# Patient Record
Sex: Female | Born: 1995 | Race: Black or African American | Hispanic: No | Marital: Single | Smoking: Current every day smoker
Health system: Southern US, Community
[De-identification: ages and names within clinical notes are randomized; demographics above are authoritative.]

## PROBLEM LIST (undated history)

## (undated) DIAGNOSIS — K219 Gastro-esophageal reflux disease without esophagitis: Secondary | ICD-10-CM

## (undated) DIAGNOSIS — E079 Disorder of thyroid, unspecified: Secondary | ICD-10-CM

## (undated) HISTORY — PX: WISDOM TOOTH EXTRACTION: SHX21

---
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
## 2013-01-13 ENCOUNTER — Ambulatory Visit: Payer: Self-pay | Admitting: Physician Assistant

## 2013-08-15 ENCOUNTER — Ambulatory Visit: Payer: Self-pay | Admitting: Family Medicine

## 2013-10-01 ENCOUNTER — Ambulatory Visit: Payer: Self-pay | Admitting: Family Medicine

## 2013-10-13 ENCOUNTER — Encounter: Payer: Self-pay | Admitting: Maternal & Fetal Medicine

## 2013-11-29 ENCOUNTER — Observation Stay: Payer: Self-pay

## 2013-11-29 LAB — URINALYSIS, COMPLETE
Bacteria: NONE SEEN
Bilirubin,UR: NEGATIVE
Glucose,UR: NEGATIVE mg/dL (ref 0–75)
Ketone: NEGATIVE
Leukocyte Esterase: NEGATIVE
Nitrite: NEGATIVE
PH: 6 (ref 4.5–8.0)
Protein: NEGATIVE
RBC,UR: 8 /HPF (ref 0–5)
Specific Gravity: 1.021 (ref 1.003–1.030)

## 2013-12-08 ENCOUNTER — Encounter: Payer: Self-pay | Admitting: Obstetrics and Gynecology

## 2014-02-24 ENCOUNTER — Observation Stay: Payer: Self-pay | Admitting: Obstetrics and Gynecology

## 2014-02-24 ENCOUNTER — Inpatient Hospital Stay: Payer: Self-pay | Admitting: Obstetrics and Gynecology

## 2014-02-24 LAB — CBC WITH DIFFERENTIAL/PLATELET
BASOS ABS: 0.1 10*3/uL (ref 0.0–0.1)
Basophil %: 0.3 %
EOS ABS: 0 10*3/uL (ref 0.0–0.7)
Eosinophil %: 0.1 %
HCT: 36.1 % (ref 35.0–47.0)
HGB: 12.4 g/dL (ref 12.0–16.0)
LYMPHS PCT: 12.4 %
Lymphocyte #: 2.1 10*3/uL (ref 1.0–3.6)
MCH: 30.9 pg (ref 26.0–34.0)
MCHC: 34.3 g/dL (ref 32.0–36.0)
MCV: 90 fL (ref 80–100)
MONO ABS: 1.1 x10 3/mm — AB (ref 0.2–0.9)
Monocyte %: 6.3 %
NEUTROS PCT: 80.9 %
Neutrophil #: 13.8 10*3/uL — ABNORMAL HIGH (ref 1.4–6.5)
PLATELETS: 178 10*3/uL (ref 150–440)
RBC: 4 10*6/uL (ref 3.80–5.20)
RDW: 13.6 % (ref 11.5–14.5)
WBC: 17.1 10*3/uL — ABNORMAL HIGH (ref 3.6–11.0)

## 2014-02-25 LAB — GC/CHLAMYDIA PROBE AMP

## 2014-02-26 LAB — HEMOGLOBIN: HGB: 10.4 g/dL — ABNORMAL LOW (ref 12.0–16.0)

## 2014-05-13 ENCOUNTER — Emergency Department: Payer: Self-pay | Admitting: Emergency Medicine

## 2014-05-13 LAB — GC/CHLAMYDIA PROBE AMP

## 2014-05-13 LAB — WET PREP, GENITAL

## 2014-06-30 ENCOUNTER — Ambulatory Visit: Payer: Self-pay | Admitting: Family Medicine

## 2014-11-03 NOTE — H&P (Signed)
L&D Evaluation:  History:   HPI 19 yo G2P1011 with LMP of 10/09/10 & [facility] 07/16/11 [facility] for Rh neg, Varicella equivocal, GERD and Alopecia. Pt presents in active labor this am with "UC's becoming more uncomfortable", No ROM, VB or decreased FM.    Presents with contractions    Patient's Medical History GERD, Scabies, Trich, Alopecia, BV, allergic rhinitis, proteinuria    Patient's Surgical History none    Medications Pre Natal Vitamins    Allergies NKDA    Social History tobacco   ROS:   ROS All systems were reviewed.  HEENT, CNS, GI, GU, Respiratory, CV, Renal and Musculoskeletal systems were found to be normal.   Exam:   Vital Signs stable    General no apparent distress    Mental Status clear    Chest clear    Heart normal sinus rhythm, no murmur/gallop/rubs    Abdomen gravid, non-tender    Estimated Fetal Weight Average for gestational age    Back no CVAT    Edema 1+    Reflexes 1+    Clonus negative    Pelvic 3-4/vtx    Mebranes Intact    FHT normal rate with no decels    Ucx q 6 mins, mod to palp    Skin dry    Lymph no lymphadenopathy   Impression:   Impression active labor   Plan:   Plan monitor contractions and for cervical change    Comments GBS neg   Electronic Signatures: Sharee PimpleJones, Caron W (CNM)  (Signed 23-Jan-13 09:49)  Authored: L&D Evaluation   Last Updated: 23-Jan-13 09:49 by Sharee PimpleJones, Caron W (CNM)

## 2014-11-03 NOTE — H&P (Signed)
L&D Evaluation:  History:  HPI 17y G3P1011 at 39.6wks presenting for labor evaluation. Last cervical exam 4 cm in office on 02/18/14, unchanged after observation here. No LOF, no vag bleeding, good FM.  Pregnancy complicated by: *Bipolar- on Latuda (Cat B), with bouts of depression. Last office Edinburgh score 8. *Chlamydia this pregnancy, treated at 36wks, no TOC yet. [facility] * 19wk anatomy scan with ?fetal arrythmia, [facility] and fetal echo, which showed no arrythmia. Repeat at 28swks confirmed. - RH neg, s/p Rhogam at 28 wks - GBS neg - Tobacco use i npregnancy -   Presents with contractions   Patient's Medical History Bipolar, hx STDs   Patient's Surgical History none   Medications Pre Natal Vitamins   Allergies NKDA   Social History tobacco  drugs   Family History Non-Contributory   ROS:  ROS All systems were reviewed.  HEENT, CNS, GI, GU, Respiratory, CV, Renal and Musculoskeletal systems were found to be normal.   Exam:  Vital Signs stable   Urine Protein not completed   General no apparent distress   Mental Status clear   Chest clear   Heart normal sinus rhythm   Abdomen gravid, non-tender   Estimated Fetal Weight Average for gestational age   Fetal Position vtx   Back no CVAT   Edema no edema   Pelvic no external lesions   Mebranes Intact   Impression:  Impression early labor   Plan:  Comments NST reactive, irregular contractions. Unchanged cervix after monitoring - Schedule 41 wk induction - D/C home with labor precautions.   Follow Up Appointment need to schedule. in 1 week   Electronic Signatures: Cline CoolsBeasley, Khamiyah Grefe E (MD)  (Signed 01-Sep-15 08:22)  Authored: L&D Evaluation   Last Updated: 01-Sep-15 08:22 by Cline CoolsBeasley, Astaria Nanez E (MD)

## 2014-11-03 NOTE — H&P (Signed)
L&D Evaluation:  History:  HPI 17y G3P1011 at 39.6wks presenting for LOF, no vag bleeding, good FM. Found to be ctx q2-4 min, bulging bag, cervical exam 5/80/0, vtx by sutures.  Pregnancy complicated by: *Bipolar- on Latuda (Cat B), with bouts of depression. Last office Edinburgh score 8. *Chlamydia this pregnancy, treated at 36wks, no TOC yet. [facility] * 19wk anatomy scan with ?fetal arrythmia, [facility] and fetal echo, which showed no arrythmia. Repeat at 28swks confirmed. - RH neg, s/p Rhogam at 28 wks - GBS neg - Tobacco use in pregnancy   Presents with contractions   Patient's Medical History Bipolar, hx STDs   Patient's Surgical History none   Medications Pre Natal Vitamins   Allergies NKDA   Social History tobacco  drugs   Family History Non-Contributory   ROS:  ROS All systems were reviewed.  HEENT, CNS, GI, GU, Respiratory, CV, Renal and Musculoskeletal systems were found to be normal.   Exam:  Vital Signs stable   Urine Protein not completed   General no apparent distress   Mental Status clear   Chest clear   Heart normal sinus rhythm   Abdomen gravid, non-tender   Estimated Fetal Weight Average for gestational age   Fetal Position vtx   Back no CVAT   Edema no edema   Pelvic no external lesions   Mebranes Intact   FHT normal rate with no decels   Ucx regular   Ucx Frequency 3 min   Length of each Contraction 60 seconds   Ucx Pain Scale 8/10   Skin dry   Lymph no lymphadenopathy   Impression:  Impression active labor   Plan:  Plan monitor contractions and for cervical change   Comments Admission for labor Desires epidural   Electronic Signatures: Cline CoolsBeasley, Liliah Dorian E (MD)  (Signed 01-Sep-15 18:51)  Authored: L&D Evaluation   Last Updated: 01-Sep-15 18:51 by Cline CoolsBeasley, Delshawn Stech E (MD)

## 2015-06-22 ENCOUNTER — Other Ambulatory Visit: Payer: Self-pay | Admitting: Family Medicine

## 2015-06-22 DIAGNOSIS — N938 Other specified abnormal uterine and vaginal bleeding: Secondary | ICD-10-CM

## 2015-06-23 ENCOUNTER — Ambulatory Visit
Admission: RE | Admit: 2015-06-23 | Discharge: 2015-06-23 | Disposition: A | Discharge status: Home / Self Care | Payer: Medicaid Other | Source: Ambulatory Visit | Attending: Family Medicine | Admitting: Family Medicine

## 2015-06-23 DIAGNOSIS — N83291 Other ovarian cyst, right side: Secondary | ICD-10-CM | POA: Diagnosis not present

## 2015-06-23 DIAGNOSIS — N938 Other specified abnormal uterine and vaginal bleeding: Secondary | ICD-10-CM | POA: Diagnosis present

## 2015-06-27 NOTE — L&D Delivery Note (Signed)
Delivery Note At 12:33 AM a viable and healthy female was delivered via Vaginal, Spontaneous Delivery (Presentation:direct OA  ).  APGAR: 9, 9; weight pending .   Placenta status: expressed, .  Cord: 3VC with the following complications: Cord blood collected  Anesthesia:  epidural Episiotomy: None Lacerations: None Est. Blood Loss (mL): 200  Mom to postpartum.  Baby to Couplet care / Skin to Skin.  19yo [id number] at 40+0 wks presenting with rupture of membranes sometime last night for clear fluid. She progressed with minimal augmentation to fully dilated and pushed over an intact perineum with a good epidural. She pushed x2 with fetal head emerging followed immediately by the fetal body. No nuchal cord. The placenta delivered through a clamped cervix intact with trailing membranes. Manual evac no tissue. Minimal bleeding, no tears. Delayed cord clamping, baby to maternal abdomen. Weight pending. Bottle feeding.  Add Dinapoli 05/29/2016, 1:00 AM

## 2015-08-05 ENCOUNTER — Encounter: Payer: Self-pay | Admitting: Emergency Medicine

## 2015-08-05 ENCOUNTER — Emergency Department
Admission: EM | Admit: 2015-08-05 | Discharge: 2015-08-05 | Disposition: A | Discharge status: Home / Self Care | Payer: Medicaid Other | Attending: Emergency Medicine | Admitting: Emergency Medicine

## 2015-08-05 DIAGNOSIS — R51 Headache: Secondary | ICD-10-CM | POA: Diagnosis present

## 2015-08-05 DIAGNOSIS — F17210 Nicotine dependence, cigarettes, uncomplicated: Secondary | ICD-10-CM | POA: Diagnosis not present

## 2015-08-05 DIAGNOSIS — J101 Influenza due to other identified influenza virus with other respiratory manifestations: Secondary | ICD-10-CM | POA: Insufficient documentation

## 2015-08-05 LAB — RAPID INFLUENZA A&B ANTIGENS (ARMC ONLY)
INFLUENZA A (ARMC): POSITIVE
INFLUENZA B (ARMC): NEGATIVE

## 2015-08-05 MED ORDER — IBUPROFEN 600 MG PO TABS
600.0000 mg | ORAL_TABLET | Freq: Once | ORAL | Status: AC
  Administered 2015-08-05: 600 mg via ORAL
  Filled 2015-08-05: qty 1

## 2015-08-05 MED ORDER — OSELTAMIVIR PHOSPHATE 75 MG PO CAPS: 75.0000 mg | ORAL_CAPSULE | Freq: Two times a day (BID) | ORAL | Status: DC

## 2015-08-05 MED ORDER — SODIUM CHLORIDE 0.9 % IV BOLUS (SEPSIS)
1000.0000 mL | Freq: Once | INTRAVENOUS | Status: AC
  Administered 2015-08-05: 1000 mL via INTRAVENOUS

## 2015-08-05 MED ORDER — HYDROCODONE-ACETAMINOPHEN 5-325 MG PO TABS: 1.0000 | ORAL_TABLET | ORAL | Status: DC | PRN

## 2015-08-05 NOTE — ED Notes (Signed)
Patient complaining of Headache, body aches, vomiting.  Started feeling bad Tuesday evening.  Patient states her son's Father had similar symptoms.  Coughing expectorating brown material.

## 2015-08-05 NOTE — ED Notes (Signed)
Patient given Motrin PO.  IV infusing well right arm.  Mother and daughter at bedside.

## 2015-08-05 NOTE — Discharge Instructions (Signed)
Influenza, Adult Influenza (flu) is an infection in the mouth, nose, and throat (respiratory tract) caused by a virus. The flu can make you feel very ill. Influenza spreads easily from person to person (contagious).  HOME CARE   Only take medicines as told by your doctor.  Use a cool mist humidifier to make breathing easier.  Get plenty of rest until your fever goes away. This usually takes 3 to 4 days.  Drink enough fluids to keep your pee (urine) clear or pale yellow.  Cover your mouth and nose when you cough or sneeze.  Wash your hands well to avoid spreading the flu.  Stay home from work or school until your fever has been gone for at least 1 full day.  Get a flu shot every year. GET HELP RIGHT AWAY IF:   You have trouble breathing or feel short of breath.  Your skin or nails turn blue.  You have severe neck pain or stiffness.  You have a severe headache, facial pain, or earache.  Your fever gets worse or keeps coming back.  You feel sick to your stomach (nauseous), throw up (vomit), or have watery poop (diarrhea).  You have chest pain.  You have a deep cough that gets worse, or you cough up more thick spit (mucus). MAKE SURE YOU:   Understand these instructions.  Will watch your condition.  Will get help right away if you are not doing well or get worse.   This information is not intended to replace advice given to you by your health care provider. Make sure you discuss any questions you have with your health care provider.   Document Released: 03/21/2008 Document Revised: 07/03/2014 Document Reviewed: 09/11/2011 Elsevier Interactive Patient Education [employer]   Follow-up with your doctor if any continued problems. Tamiflu twice a day for 7 days. Norco for body aches and for coughing. You may need to take ibuprofen and also with this to control her temperature. Increase fluids.

## 2015-08-05 NOTE — ED Notes (Signed)
Labs drawn with IV start.  Rapid influenza done.

## 2015-08-05 NOTE — ED Notes (Signed)
IV Discontinued from right forearm without redness or drainage.

## 2015-08-05 NOTE — ED Provider Notes (Signed)
[facility] Emergency Department Provider Note ____________________________________________  Time seen: Approximately 8:48 AM  I have reviewed the triage vital signs and the nursing notes.   HISTORY  Chief Complaint Generalized Body Aches   HPI Darlene Kelley is a 20 y.o. female is here with complaint of body aches, headache, fever for 2 days. Patient states that she vomited a couple times and has a brown productive cough. Patient has not taken any over-the-counter medication for fever reduction today. Denies any diarrhea. She states that her son's father has similar symptoms.Currently she rates her pain as a 9 out of 10.   History reviewed. No pertinent past medical history.  There are no active problems to display for this patient.   History reviewed. No pertinent past surgical history.  Current Outpatient Rx  Name  Route  Sig  Dispense  Refill  . HYDROcodone-acetaminophen (NORCO/VICODIN) 5-325 MG tablet   Oral   Take 1 tablet by mouth every 4 (four) hours as needed for moderate pain.   20 tablet   0   . oseltamivir (TAMIFLU) 75 MG capsule   Oral   Take 1 capsule (75 mg total) by mouth 2 (two) times daily.   14 capsule   0     Allergies Review of patient's allergies indicates no known allergies.  No family history on file.  Social History Social History  Substance Use Topics  . Smoking status: Current Every Day Smoker -- 0.50 packs/day    Types: Cigarettes  . Smokeless tobacco: None  . Alcohol Use: Yes     Comment: Occasional    Review of Systems Constitutional: Positive fever/chills Eyes: No visual changes. ENT: No sore throat. Cardiovascular: Denies chest pain. Respiratory: Denies shortness of breath. Positive productive cough. Gastrointestinal: No abdominal pain.  No nausea, positive vomiting.  No diarrhea.  No constipation. Genitourinary: Negative for dysuria. Musculoskeletal: Negative for back pain. Skin: Negative for  rash. Neurological: Negative for headaches, focal weakness or numbness.  10-point ROS otherwise negative.  ____________________________________________   PHYSICAL EXAM:  VITAL SIGNS: ED Triage Vitals  Enc Vitals Group     BP --      Pulse --      Resp --      Temp --      Temp src --      SpO2 --      Weight --      Height --      Head Cir --      Peak Flow --      Pain Score --      Pain Loc --      Pain Edu? --      Excl. in GC? --     Constitutional: Alert and oriented. Well appearing and in no acute distress. Patient makes little eye contact and appears to be ill. Eyes: Conjunctivae are normal. PERRL. EOMI. Head: Atraumatic. Nose: Moderate congestion/no rhinnorhea.  EACs and TMs are clear bilaterally. Mouth/Throat: Mucous membranes are moist.  Oropharynx non-erythematous. Neck: No stridor.   Hematological/Lymphatic/Immunilogical: No cervical lymphadenopathy. Cardiovascular: Normal rate, regular rhythm. Grossly normal heart sounds.  Good peripheral circulation. Respiratory: Normal respiratory effort.  No retractions. Lungs CTAB. Gastrointestinal: Soft and nontender. No distention.  Musculoskeletal: His upper and lower extremities without difficulty. Neurologic:  Normal speech and language. No gross focal neurologic deficits are appreciated. No gait instability. Skin:  Skin is warm, dry and intact. No rash noted. Psychiatric: Mood and affect are normal. Speech and behavior  are normal.  ____________________________________________   LABS (all labs ordered are listed, but only abnormal results are displayed)  Labs Reviewed  RAPID INFLUENZA A&B ANTIGENS ([facility] ONLY)     PROCEDURES  Procedure(s) performed: None  Critical Care performed: No  ____________________________________________   INITIAL IMPRESSION / ASSESSMENT AND PLAN / ED COURSE  Pertinent labs & imaging results that were available during my care of the patient were reviewed by me and considered  in my medical decision making (see chart for details).  Patient was started on Tamiflu for the next 7 days. She is also given a prescription for Norco as needed for headache, fever, and body aches. She is to follow-up with her doctor or [facility] if any continued problems. She is aware that she needs to increase fluids and also take ibuprofen when needed for her fever. ____________________________________________   FINAL CLINICAL IMPRESSION(S) / ED DIAGNOSES  Final diagnoses:  Influenza A      Tommi Rumps, PA-C 08/05/15 1321  Sharyn Creamer, MD 08/05/15 [phone]

## 2015-09-20 ENCOUNTER — Encounter: Payer: Self-pay | Admitting: Emergency Medicine

## 2015-09-20 ENCOUNTER — Emergency Department
Admission: EM | Admit: 2015-09-20 | Discharge: 2015-09-20 | Disposition: A | Discharge status: Home / Self Care | Payer: Medicaid Other | Attending: Emergency Medicine | Admitting: Emergency Medicine

## 2015-09-20 DIAGNOSIS — F17210 Nicotine dependence, cigarettes, uncomplicated: Secondary | ICD-10-CM | POA: Diagnosis not present

## 2015-09-20 DIAGNOSIS — Y92410 Unspecified street and highway as the place of occurrence of the external cause: Secondary | ICD-10-CM | POA: Insufficient documentation

## 2015-09-20 DIAGNOSIS — O9A219 Injury, poisoning and certain other consequences of external causes complicating pregnancy, unspecified trimester: Secondary | ICD-10-CM | POA: Diagnosis not present

## 2015-09-20 DIAGNOSIS — Z3A00 Weeks of gestation of pregnancy not specified: Secondary | ICD-10-CM | POA: Diagnosis not present

## 2015-09-20 DIAGNOSIS — Y998 Other external cause status: Secondary | ICD-10-CM | POA: Insufficient documentation

## 2015-09-20 DIAGNOSIS — Y9389 Activity, other specified: Secondary | ICD-10-CM | POA: Diagnosis not present

## 2015-09-20 DIAGNOSIS — S139XXA Sprain of joints and ligaments of unspecified parts of neck, initial encounter: Secondary | ICD-10-CM

## 2015-09-20 DIAGNOSIS — S134XXA Sprain of ligaments of cervical spine, initial encounter: Secondary | ICD-10-CM | POA: Diagnosis not present

## 2015-09-20 DIAGNOSIS — Z79899 Other long term (current) drug therapy: Secondary | ICD-10-CM | POA: Diagnosis not present

## 2015-09-20 DIAGNOSIS — O99330 Smoking (tobacco) complicating pregnancy, unspecified trimester: Secondary | ICD-10-CM | POA: Insufficient documentation

## 2015-09-20 LAB — POCT PREGNANCY, URINE: Preg Test, Ur: POSITIVE — AB

## 2015-09-20 NOTE — ED Notes (Signed)
Urine sample sent to lab

## 2015-09-20 NOTE — ED Notes (Signed)
Pt with neck pain, tender to touch, no numbness or weakness bilateral. C/o abd cramping. Per pt + pregnant test 2 days ago.
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
## 2015-09-20 NOTE — ED Notes (Signed)
Pt informed to return if any life threatening symptoms occur.  

## 2015-09-20 NOTE — Discharge Instructions (Signed)
Cervical Sprain  A cervical sprain is an injury in the neck in which the strong, fibrous tissues (ligaments) that connect your neck bones stretch or tear. Cervical sprains can range from mild to severe. Severe cervical sprains can cause the neck vertebrae to be unstable. This can lead to damage of the spinal cord and can result in serious nervous system problems. The amount of time it takes for a cervical sprain to get better depends on the cause and extent of the injury. Most cervical sprains heal in 1 to 3 weeks.  CAUSES   Severe cervical sprains may be caused by:    Contact sport injuries (such as from football, rugby, wrestling, hockey, auto racing, gymnastics, diving, martial arts, or boxing).    Motor vehicle collisions.    Whiplash injuries. This is an injury from a sudden forward and backward whipping movement of the head and neck.   Falls.   Mild cervical sprains may be caused by:    Being in an awkward position, such as while cradling a telephone between your ear and shoulder.    Sitting in a chair that does not offer proper support.    Working at a poorly designed computer station.    Looking up or down for long periods of time.   SYMPTOMS    Pain, soreness, stiffness, or a burning sensation in the front, back, or sides of the neck. This discomfort may develop immediately after the injury or slowly, 24 hours or more after the injury.    Pain or tenderness directly in the middle of the back of the neck.    Shoulder or upper back pain.    Limited ability to move the neck.    Headache.    Dizziness.    Weakness, numbness, or tingling in the hands or arms.    Muscle spasms.    Difficulty swallowing or chewing.    Tenderness and swelling of the neck.   DIAGNOSIS   Most of the time your health care provider can diagnose a cervical sprain by taking your history and doing a physical exam. Your health care provider will ask about previous neck injuries and any known neck  problems, such as arthritis in the neck. X-rays may be taken to find out if there are any other problems, such as with the bones of the neck. Other tests, such as a CT scan or MRI, may also be needed.   TREATMENT   Treatment depends on the severity of the cervical sprain. Mild sprains can be treated with rest, keeping the neck in place (immobilization), and pain medicines. Severe cervical sprains are immediately immobilized. Further treatment is done to help with pain, muscle spasms, and other symptoms and may include:   Medicines, such as pain relievers, numbing medicines, or muscle relaxants.    Physical therapy. This may involve stretching exercises, strengthening exercises, and posture training. Exercises and improved posture can help stabilize the neck, strengthen muscles, and help stop symptoms from returning.   HOME CARE INSTRUCTIONS    Put ice on the injured area.     Put ice in a plastic bag.     Place a towel between your skin and the bag.     Leave the ice on for 15-20 minutes, 3-4 times a day.    If your injury was severe, you may have been given a cervical collar to wear. A cervical collar is a two-piece collar designed to keep your neck from moving while it heals.      Do not remove the collar unless instructed by your health care provider.    If you have long hair, keep it outside of the collar.    Ask your health care provider before making any adjustments to your collar. Minor adjustments may be required over time to improve comfort and reduce pressure on your chin or on the back of your head.    Ifyou are allowed to remove the collar for cleaning or bathing, follow your health care provider's instructions on how to do so safely.    Keep your collar clean by wiping it with mild soap and water and drying it completely. If the collar you have been given includes removable pads, remove them every 1-2 days and hand wash them with soap and water. Allow them to air dry. They should be completely  dry before you wear them in the collar.    If you are allowed to remove the collar for cleaning and bathing, wash and dry the skin of your neck. Check your skin for irritation or sores. If you see any, tell your health care provider.    Do not drive while wearing the collar.    Only take over-the-counter or prescription medicines for pain, discomfort, or fever as directed by your health care provider.    Keep all follow-up appointments as directed by your health care provider.    Keep all physical therapy appointments as directed by your health care provider.    Make any needed adjustments to your workstation to promote good posture.    Avoid positions and activities that make your symptoms worse.    Warm up and stretch before being active to help prevent problems.   SEEK MEDICAL CARE IF:    Your pain is not controlled with medicine.    You are unable to decrease your pain medicine over time as planned.    Your activity level is not improving as expected.   SEEK IMMEDIATE MEDICAL CARE IF:    You develop any bleeding.   You develop stomach upset.   You have signs of an allergic reaction to your medicine.    Your symptoms get worse.    You develop new, unexplained symptoms.    You have numbness, tingling, weakness, or paralysis in any part of your body.   MAKE SURE YOU:    Understand these instructions.   Will watch your condition.   Will get help right away if you are not doing well or get worse.     This information is not intended to replace advice given to you by your health care provider. Make sure you discuss any questions you have with your health care provider.     Document Released: 04/09/2007 Document Revised: 06/17/2013 Document Reviewed: 12/18/2012  Elsevier Interactive Patient Education 2016 Else[employer] Inc[employer]
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
## 2015-09-27 ENCOUNTER — Other Ambulatory Visit: Payer: Self-pay | Admitting: Family Medicine

## 2015-09-27 DIAGNOSIS — Z3491 Encounter for supervision of normal pregnancy, unspecified, first trimester: Secondary | ICD-10-CM

## 2015-10-05 ENCOUNTER — Ambulatory Visit
Admission: RE | Admit: 2015-10-05 | Discharge: 2015-10-05 | Disposition: A | Discharge status: Home / Self Care | Payer: Medicaid Other | Source: Ambulatory Visit | Attending: Family Medicine | Admitting: Family Medicine

## 2015-10-05 DIAGNOSIS — Z3481 Encounter for supervision of other normal pregnancy, first trimester: Secondary | ICD-10-CM | POA: Insufficient documentation

## 2015-10-05 DIAGNOSIS — Z3491 Encounter for supervision of normal pregnancy, unspecified, first trimester: Secondary | ICD-10-CM

## 2015-10-19 LAB — OB RESULTS CONSOLE VARICELLA ZOSTER ANTIBODY, IGG: Varicella: IMMUNE

## 2015-10-19 LAB — OB RESULTS CONSOLE HIV ANTIBODY (ROUTINE TESTING): HIV: NONREACTIVE

## 2015-10-19 LAB — OB RESULTS CONSOLE ANTIBODY SCREEN: Antibody Screen: NEGATIVE

## 2015-10-19 LAB — OB RESULTS CONSOLE ABO/RH: RH TYPE: NEGATIVE

## 2015-10-19 LAB — OB RESULTS CONSOLE GC/CHLAMYDIA
Chlamydia: NEGATIVE
GC PROBE AMP, GENITAL: NEGATIVE

## 2015-10-19 LAB — OB RESULTS CONSOLE HEPATITIS B SURFACE ANTIGEN: Hepatitis B Surface Ag: NEGATIVE

## 2015-10-19 LAB — OB RESULTS CONSOLE RPR: RPR: NONREACTIVE

## 2015-10-19 LAB — OB RESULTS CONSOLE RUBELLA ANTIBODY, IGM: Rubella: IMMUNE

## 2015-10-28 ENCOUNTER — Other Ambulatory Visit: Payer: Self-pay | Admitting: Family Medicine

## 2015-10-28 DIAGNOSIS — E010 Iodine-deficiency related diffuse (endemic) goiter: Secondary | ICD-10-CM

## 2015-11-02 ENCOUNTER — Ambulatory Visit
Admission: RE | Admit: 2015-11-02 | Discharge: 2015-11-02 | Disposition: A | Discharge status: Home / Self Care | Payer: Medicaid Other | Source: Ambulatory Visit | Attending: Family Medicine | Admitting: Family Medicine

## 2015-11-02 DIAGNOSIS — E042 Nontoxic multinodular goiter: Secondary | ICD-10-CM | POA: Diagnosis not present

## 2015-11-02 DIAGNOSIS — E010 Iodine-deficiency related diffuse (endemic) goiter: Secondary | ICD-10-CM

## 2015-11-29 ENCOUNTER — Other Ambulatory Visit: Payer: Self-pay | Admitting: Otolaryngology

## 2015-11-29 DIAGNOSIS — E041 Nontoxic single thyroid nodule: Secondary | ICD-10-CM

## 2015-12-03 ENCOUNTER — Ambulatory Visit: Admission: RE | Admit: 2015-12-03 | Payer: Medicaid Other | Source: Ambulatory Visit | Admitting: Otolaryngology

## 2015-12-21 ENCOUNTER — Other Ambulatory Visit: Payer: Self-pay | Admitting: Family Medicine

## 2015-12-21 DIAGNOSIS — Z3492 Encounter for supervision of normal pregnancy, unspecified, second trimester: Secondary | ICD-10-CM

## 2016-01-03 ENCOUNTER — Ambulatory Visit
Admission: RE | Admit: 2016-01-03 | Discharge: 2016-01-03 | Disposition: A | Discharge status: Home / Self Care | Payer: Medicaid Other | Source: Ambulatory Visit | Attending: Family Medicine | Admitting: Family Medicine

## 2016-01-03 DIAGNOSIS — Z3482 Encounter for supervision of other normal pregnancy, second trimester: Secondary | ICD-10-CM | POA: Diagnosis not present

## 2016-01-03 DIAGNOSIS — Z3492 Encounter for supervision of normal pregnancy, unspecified, second trimester: Secondary | ICD-10-CM

## 2016-01-03 DIAGNOSIS — Z3A19 19 weeks gestation of pregnancy: Secondary | ICD-10-CM | POA: Diagnosis not present

## 2016-01-10 ENCOUNTER — Emergency Department
Admission: EM | Admit: 2016-01-10 | Discharge: 2016-01-11 | Disposition: A | Discharge status: Home / Self Care | Payer: Medicaid Other | Attending: Emergency Medicine | Admitting: Emergency Medicine

## 2016-01-10 ENCOUNTER — Encounter: Payer: Self-pay | Admitting: Emergency Medicine

## 2016-01-10 DIAGNOSIS — E876 Hypokalemia: Secondary | ICD-10-CM | POA: Diagnosis not present

## 2016-01-10 DIAGNOSIS — O26892 Other specified pregnancy related conditions, second trimester: Secondary | ICD-10-CM | POA: Diagnosis not present

## 2016-01-10 DIAGNOSIS — Z79899 Other long term (current) drug therapy: Secondary | ICD-10-CM | POA: Insufficient documentation

## 2016-01-10 DIAGNOSIS — O99332 Smoking (tobacco) complicating pregnancy, second trimester: Secondary | ICD-10-CM | POA: Insufficient documentation

## 2016-01-10 DIAGNOSIS — J029 Acute pharyngitis, unspecified: Secondary | ICD-10-CM | POA: Diagnosis not present

## 2016-01-10 DIAGNOSIS — O99512 Diseases of the respiratory system complicating pregnancy, second trimester: Secondary | ICD-10-CM | POA: Insufficient documentation

## 2016-01-10 DIAGNOSIS — E860 Dehydration: Secondary | ICD-10-CM

## 2016-01-10 DIAGNOSIS — F17210 Nicotine dependence, cigarettes, uncomplicated: Secondary | ICD-10-CM | POA: Insufficient documentation

## 2016-01-10 DIAGNOSIS — Z3A20 20 weeks gestation of pregnancy: Secondary | ICD-10-CM | POA: Diagnosis not present

## 2016-01-10 DIAGNOSIS — J209 Acute bronchitis, unspecified: Secondary | ICD-10-CM

## 2016-01-10 DIAGNOSIS — R112 Nausea with vomiting, unspecified: Secondary | ICD-10-CM

## 2016-01-10 DIAGNOSIS — R102 Pelvic and perineal pain: Secondary | ICD-10-CM | POA: Diagnosis present

## 2016-01-10 DIAGNOSIS — Z3492 Encounter for supervision of normal pregnancy, unspecified, second trimester: Secondary | ICD-10-CM

## 2016-01-10 LAB — URINALYSIS COMPLETE WITH MICROSCOPIC (ARMC ONLY)
BILIRUBIN URINE: NEGATIVE
GLUCOSE, UA: NEGATIVE mg/dL
Ketones, ur: NEGATIVE mg/dL
Nitrite: NEGATIVE
Protein, ur: NEGATIVE mg/dL
Specific Gravity, Urine: 1.02 (ref 1.005–1.030)
pH: 6 (ref 5.0–8.0)

## 2016-01-10 LAB — COMPREHENSIVE METABOLIC PANEL
ALBUMIN: 3.1 g/dL — AB (ref 3.5–5.0)
ALK PHOS: 63 U/L (ref 38–126)
ALT: 10 U/L — AB (ref 14–54)
AST: 13 U/L — ABNORMAL LOW (ref 15–41)
Anion gap: 4 — ABNORMAL LOW (ref 5–15)
BUN: 9 mg/dL (ref 6–20)
CHLORIDE: 107 mmol/L (ref 101–111)
CO2: 26 mmol/L (ref 22–32)
CREATININE: 0.39 mg/dL — AB (ref 0.44–1.00)
Calcium: 8.7 mg/dL — ABNORMAL LOW (ref 8.9–10.3)
GFR calc non Af Amer: >60 mL/min (ref >60)
GLUCOSE: 84 mg/dL (ref 65–99)
Potassium: 3.1 mmol/L — ABNORMAL LOW (ref 3.5–5.1)
SODIUM: 137 mmol/L (ref 135–145)
Total Bilirubin: 0.6 mg/dL (ref 0.3–1.2)
Total Protein: 6.4 g/dL — ABNORMAL LOW (ref 6.5–8.1)

## 2016-01-10 LAB — LIPASE, BLOOD: LIPASE: 18 U/L (ref 11–51)

## 2016-01-10 LAB — CBC
HCT: 30.9 % — ABNORMAL LOW (ref 35.0–47.0)
HEMOGLOBIN: 11 g/dL — AB (ref 12.0–16.0)
MCH: 31.7 pg (ref 26.0–34.0)
MCHC: 35.6 g/dL (ref 32.0–36.0)
MCV: 88.9 fL (ref 80.0–100.0)
Platelets: 184 10*3/uL (ref 150–440)
RBC: 3.48 MIL/uL — AB (ref 3.80–5.20)
RDW: 13.1 % (ref 11.5–14.5)
WBC: 14.4 10*3/uL — ABNORMAL HIGH (ref 3.6–11.0)

## 2016-01-10 LAB — HCG, QUANTITATIVE, PREGNANCY: HCG, BETA CHAIN, QUANT, S: 34089 m[IU]/mL — AB (ref <5)

## 2016-01-10 MED ORDER — SODIUM CHLORIDE 0.9 % IV BOLUS (SEPSIS)
1000.0000 mL | Freq: Once | INTRAVENOUS | Status: AC
  Administered 2016-01-10: 1000 mL via INTRAVENOUS

## 2016-01-10 MED ORDER — ONDANSETRON HCL 4 MG/2ML IJ SOLN
4.0000 mg | Freq: Once | INTRAMUSCULAR | Status: AC
  Administered 2016-01-10: 4 mg via INTRAVENOUS
  Filled 2016-01-10: qty 2

## 2016-01-10 NOTE — ED Provider Notes (Addendum)
[facility] Emergency Department Provider Note  ___________________________________________  Time seen: Approximately 11:12 PM  I have reviewed the triage vital signs and the nursing notes.   HISTORY  Chief Complaint Emesis    HPI Darlene Kelley is a 20 y.o. female who is approximately 5 months OB, and has had sore throat, runny nose, congested cough, and multiple episodes of vomiting since yesterday. Patient states also had some urinary frequency and foul smell to her urine, along with suprapubic abdominal pain. Patient states that her daughter was sick and was seen for the same last evening. Patient denies any associated fever, chills, diarrhea, or chest pain/shortness of breath. Patient states that her suprapubic pain on scale of 0-10 is about a 1. She also denies any significant headache, dizziness, or generalized weakness.   History reviewed. No pertinent past medical history.  There are no active problems to display for this patient.   History reviewed. No pertinent past surgical history.  Current Outpatient Rx  Name  Route  Sig  Dispense  Refill  . Prenatal Vit-Fe Fumarate-FA (MULTIVITAMIN-PRENATAL) 27-0.8 MG TABS tablet   Oral   Take 1 tablet by mouth daily.         Marland Kitchen amoxicillin (AMOXIL) 500 MG capsule   Oral   Take 1 capsule (500 mg total) by mouth 3 (three) times daily.   30 capsule   0   . HYDROcodone-acetaminophen (NORCO/VICODIN) 5-325 MG tablet   Oral   Take 1 tablet by mouth every 4 (four) hours as needed for moderate pain. Patient not taking: Reported on 01/10/2016   20 tablet   0   . ondansetron (ZOFRAN ODT) 4 MG disintegrating tablet   Oral   Take 1 tablet (4 mg total) by mouth every 8 (eight) hours as needed for nausea or vomiting.   14 tablet   0   . oseltamivir (TAMIFLU) 75 MG capsule   Oral   Take 1 capsule (75 mg total) by mouth 2 (two) times daily. Patient not taking: Reported on 01/10/2016   14 capsule   0      Allergies Review of patient's allergies indicates no known allergies.  No family history on file.  Social History Social History  Substance Use Topics  . Smoking status: Current Every Day Smoker -- 0.00 packs/day    Types: Cigarettes  . Smokeless tobacco: None  . Alcohol Use: Yes     Comment: Occasional    Review of Systems Constitutional: No fever/chills Eyes: No visual changes. ENT: Positive for sore throat and runny nose Cardiovascular: Denies chest pain. Respiratory: Denies shortness of breath. Positive for congested cough Gastrointestinal: Positive for mild suprapubic abdominal pain.  Positive for nausea and vomiting.  No diarrhea.  No constipation. Genitourinary: Negative for dysuria, but positive for urinary frequency and foul-smelling urine. Musculoskeletal: Negative for back pain. Skin: Negative for rash. Neurological: Negative for headaches, focal weakness or numbness.  10-point ROS otherwise negative.  ____________________________________________   PHYSICAL EXAM:  VITAL SIGNS: ED Triage Vitals  Enc Vitals Group     BP 01/10/16 2158 120/72 mmHg     Pulse Rate 01/10/16 2158 97     Resp 01/10/16 2158 18     Temp 01/10/16 2158 98.4 F (36.9 C)     Temp Source 01/10/16 2158 Oral     SpO2 01/10/16 2158 100 %     Weight 01/10/16 2158 113 lb (51.256 kg)     Height 01/10/16 2158 5\' 6"  (1.676 m)  Head Cir --      Peak Flow --      Pain Score 01/10/16 2158 9     Pain Loc --      Pain Edu? --      Excl. in GC? --     Constitutional: Alert and oriented. Well appearing and in Mild acute distress from all of her symptoms Eyes: Conjunctivae are normal. PERRL. EOMI. Head: Atraumatic. Nose: Positive for congestion or rhinorrhea but no tenderness over the sinuses. Mouth/Throat: Mucous membranes are moist.  Oropharynx with mild erythema but no significant swelling or exudates. Neck: No stridor.  Mild tender anterior cervical lymphadenopathy  bilaterally. Cardiovascular: Normal rate, regular rhythm. Grossly normal heart sounds.  Good peripheral circulation. Respiratory: Normal respiratory effort.  No retractions. Lungs CTAB. Gastrointestinal: Soft with mild suprapubic tenderness. No distention. No abdominal bruits. No CVA tenderness. Musculoskeletal: No lower extremity tenderness nor edema.  No joint effusions. Neurologic:  Normal speech and language. No gross focal neurologic deficits are appreciated. No gait instability. Skin:  Skin is warm, dry and intact. No rash noted. Psychiatric: Mood and affect are normal. Speech and behavior are normal.  ____________________________________________   LABS (all labs ordered are listed, but only abnormal results are displayed)  Labs Reviewed  COMPREHENSIVE METABOLIC PANEL - Abnormal; Notable for the following:    Potassium 3.1 (*)    Creatinine, Ser 0.39 (*)    Calcium 8.7 (*)    Total Protein 6.4 (*)    Albumin 3.1 (*)    AST 13 (*)    ALT 10 (*)    Anion gap 4 (*)    All other components within normal limits  CBC - Abnormal; Notable for the following:    WBC 14.4 (*)    RBC 3.48 (*)    Hemoglobin 11.0 (*)    HCT 30.9 (*)    All other components within normal limits  URINALYSIS COMPLETEWITH MICROSCOPIC ([facility] ONLY) - Abnormal; Notable for the following:    Color, Urine YELLOW (*)    APPearance CLEAR (*)    Hgb urine dipstick 1+ (*)    Leukocytes, UA TRACE (*)    Bacteria, UA RARE (*)    Squamous Epithelial / LPF 0-5 (*)    All other components within normal limits  HCG, QUANTITATIVE, PREGNANCY - Abnormal; Notable for the following:    hCG, Beta Chain, Quant, S [id number] (*)    All other components within normal limits  LIPASE, BLOOD   ____________________________________________  EKG  None ____________________________________________  RADIOLOGY  None ____________________________________________   PROCEDURES  Procedure(s) performed:  None  Procedures  Critical Care performed: None  ____________________________________________   INITIAL IMPRESSION / ASSESSMENT AND PLAN / ED COURSE  Pertinent labs & imaging results that were available during my care of the patient were reviewed by me and considered in my medical decision making (see chart for details).  2:41 AM Patient will get a throat swab, along with routine labs. Patient will be started on some IV fluids and given some IV Zofran for nausea.  2:41 AM 01/11/16 Patient will get a by mouth fluid challenge and a gram of IV Rocephin for her pharyngitis and URI.  2:41 AM Patient was given a second dose of Zofran for her persistent nausea but was finally able to tolerate by mouth fluids. Patient was told that she will have to call home and take her amoxicillin along with Zofran for her nausea and just try to take it easy and get better  with this infection. She was also given some potassium by mouth prior to discharge for her potassium at 3.1. Patient was told to drink plenty of clear liquids and eat a bland diet. She was also told to return immediately if condition worsens. ____________________________________________   FINAL CLINICAL IMPRESSION(S) / ED DIAGNOSES  Final diagnoses:  Non-intractable vomiting with nausea, vomiting of unspecified type  Dehydration  Acute pharyngitis, unspecified etiology  Acute bronchitis, unspecified organism  Second trimester pregnancy  Hypokalemia      NEW MEDICATIONS STARTED DURING THIS VISIT:  New Prescriptions   AMOXICILLIN (AMOXIL) 500 MG CAPSULE    Take 1 capsule (500 mg total) by mouth 3 (three) times daily.   ONDANSETRON (ZOFRAN ODT) 4 MG DISINTEGRATING TABLET    Take 1 tablet (4 mg total) by mouth every 8 (eight) hours as needed for nausea or vomiting.     Note:  This document was prepared using Dragon voice recognition software and may include unintentional dictation errors.    Leona Carry, MD 01/11/16  0134  Leona Carry, MD 01/11/16 [phone]

## 2016-01-10 NOTE — ED Notes (Addendum)
Patient ambulatory to triage with steady gait, pt is 5 months pregnant. Pt reports vomiting and nausea beginning approx 6 hours ago, along with runny nose, sore throat, and lightheadedness. Pt reports vomited about 6 times today, states began to have cramping feeling in abdomen, states "when I cough I have clear fluid and sometimes white fluid." Pt alert and oriented x 4, no increased work in breathing noted. G3P2

## 2016-01-11 MED ORDER — ONDANSETRON HCL 4 MG/2ML IJ SOLN
4.0000 mg | Freq: Once | INTRAMUSCULAR | Status: AC
  Administered 2016-01-11: 4 mg via INTRAVENOUS

## 2016-01-11 MED ORDER — AMOXICILLIN 500 MG PO CAPS: 500.0000 mg | ORAL_CAPSULE | Freq: Three times a day (TID) | ORAL | Status: DC

## 2016-01-11 MED ORDER — ONDANSETRON 4 MG PO TBDP: 4.0000 mg | ORAL_TABLET | Freq: Three times a day (TID) | ORAL | Status: DC | PRN

## 2016-01-11 MED ORDER — ONDANSETRON HCL 4 MG/2ML IJ SOLN
INTRAMUSCULAR | Status: AC
  Administered 2016-01-11: 4 mg via INTRAVENOUS
  Filled 2016-01-11: qty 2

## 2016-01-11 MED ORDER — POTASSIUM CHLORIDE CRYS ER 20 MEQ PO TBCR: 40.0000 meq | EXTENDED_RELEASE_TABLET | Freq: Once | ORAL | Status: DC

## 2016-01-11 MED ORDER — DEXTROSE 5 % IV SOLN
1.0000 g | Freq: Once | INTRAVENOUS | Status: AC
  Administered 2016-01-11: 1 g via INTRAVENOUS
  Filled 2016-01-11: qty 10

## 2016-01-11 MED ORDER — POTASSIUM CHLORIDE CRYS ER 20 MEQ PO TBCR
40.0000 meq | EXTENDED_RELEASE_TABLET | Freq: Once | ORAL | Status: AC
  Administered 2016-01-11: 40 meq via ORAL
  Filled 2016-01-11: qty 2

## 2016-01-11 NOTE — ED Notes (Signed)
Strep test result: NEGATIVE

## 2016-01-14 ENCOUNTER — Emergency Department
Admission: EM | Admit: 2016-01-14 | Discharge: 2016-01-14 | Disposition: A | Discharge status: Home / Self Care | Payer: Medicaid Other | Attending: Emergency Medicine | Admitting: Emergency Medicine

## 2016-01-14 ENCOUNTER — Emergency Department: Payer: Medicaid Other

## 2016-01-14 ENCOUNTER — Encounter: Payer: Self-pay | Admitting: Emergency Medicine

## 2016-01-14 DIAGNOSIS — Y92410 Unspecified street and highway as the place of occurrence of the external cause: Secondary | ICD-10-CM | POA: Diagnosis not present

## 2016-01-14 DIAGNOSIS — Z3A21 21 weeks gestation of pregnancy: Secondary | ICD-10-CM | POA: Diagnosis not present

## 2016-01-14 DIAGNOSIS — R102 Pelvic and perineal pain: Secondary | ICD-10-CM | POA: Diagnosis not present

## 2016-01-14 DIAGNOSIS — M25512 Pain in left shoulder: Secondary | ICD-10-CM | POA: Diagnosis not present

## 2016-01-14 DIAGNOSIS — Y999 Unspecified external cause status: Secondary | ICD-10-CM | POA: Insufficient documentation

## 2016-01-14 DIAGNOSIS — F17210 Nicotine dependence, cigarettes, uncomplicated: Secondary | ICD-10-CM | POA: Diagnosis not present

## 2016-01-14 DIAGNOSIS — O99332 Smoking (tobacco) complicating pregnancy, second trimester: Secondary | ICD-10-CM | POA: Diagnosis not present

## 2016-01-14 DIAGNOSIS — Z3492 Encounter for supervision of normal pregnancy, unspecified, second trimester: Secondary | ICD-10-CM

## 2016-01-14 DIAGNOSIS — O9A212 Injury, poisoning and certain other consequences of external causes complicating pregnancy, second trimester: Secondary | ICD-10-CM | POA: Diagnosis present

## 2016-01-14 DIAGNOSIS — Y9389 Activity, other specified: Secondary | ICD-10-CM | POA: Insufficient documentation

## 2016-01-14 LAB — COMPREHENSIVE METABOLIC PANEL
ALBUMIN: 2.9 g/dL — AB (ref 3.5–5.0)
ALK PHOS: 64 U/L (ref 38–126)
ALT: 10 U/L — ABNORMAL LOW (ref 14–54)
ANION GAP: 5 (ref 5–15)
AST: 19 U/L (ref 15–41)
BILIRUBIN TOTAL: 0.9 mg/dL (ref 0.3–1.2)
BUN: 13 mg/dL (ref 6–20)
CALCIUM: 8.8 mg/dL — AB (ref 8.9–10.3)
CO2: 24 mmol/L (ref 22–32)
CREATININE: 0.43 mg/dL — AB (ref 0.44–1.00)
Chloride: 106 mmol/L (ref 101–111)
GFR calc Af Amer: >60 mL/min (ref >60)
GFR calc non Af Amer: >60 mL/min (ref >60)
GLUCOSE: 122 mg/dL — AB (ref 65–99)
Potassium: 3.2 mmol/L — ABNORMAL LOW (ref 3.5–5.1)
Sodium: 135 mmol/L (ref 135–145)
TOTAL PROTEIN: 6.2 g/dL — AB (ref 6.5–8.1)

## 2016-01-14 LAB — CBC WITH DIFFERENTIAL/PLATELET
BASOS PCT: 0 %
Basophils Absolute: 0 10*3/uL (ref 0–0.1)
Eosinophils Absolute: 0.3 10*3/uL (ref 0–0.7)
Eosinophils Relative: 3 %
HEMATOCRIT: 29.7 % — AB (ref 35.0–47.0)
HEMOGLOBIN: 10.5 g/dL — AB (ref 12.0–16.0)
LYMPHS ABS: 2.1 10*3/uL (ref 1.0–3.6)
Lymphocytes Relative: 21 %
MCH: 31.4 pg (ref 26.0–34.0)
MCHC: 35.4 g/dL (ref 32.0–36.0)
MCV: 88.9 fL (ref 80.0–100.0)
MONOS PCT: 6 %
Monocytes Absolute: 0.6 10*3/uL (ref 0.2–0.9)
NEUTROS ABS: 6.7 10*3/uL — AB (ref 1.4–6.5)
NEUTROS PCT: 70 %
Platelets: 170 10*3/uL (ref 150–440)
RBC: 3.34 MIL/uL — ABNORMAL LOW (ref 3.80–5.20)
RDW: 12.6 % (ref 11.5–14.5)
WBC: 9.8 10*3/uL (ref 3.6–11.0)

## 2016-01-14 LAB — URINALYSIS COMPLETE WITH MICROSCOPIC (ARMC ONLY)
Bilirubin Urine: NEGATIVE
GLUCOSE, UA: NEGATIVE mg/dL
Hgb urine dipstick: NEGATIVE
Ketones, ur: NEGATIVE mg/dL
Nitrite: NEGATIVE
Protein, ur: 100 mg/dL — AB
Specific Gravity, Urine: 1.026 (ref 1.005–1.030)
pH: 6 (ref 5.0–8.0)

## 2016-01-14 LAB — HCG, QUANTITATIVE, PREGNANCY: HCG, BETA CHAIN, QUANT, S: 38370 m[IU]/mL — AB (ref <5)

## 2016-01-14 MED ORDER — CYCLOBENZAPRINE HCL 10 MG PO TABS: 10.0000 mg | ORAL_TABLET | Freq: Three times a day (TID) | ORAL | Status: DC | PRN

## 2016-01-14 NOTE — Discharge Instructions (Signed)
Second Trimester of Pregnancy The second trimester is from week 13 through week 28, months 4 through 6. The second trimester is often a time when you feel your best. Your body has also adjusted to being pregnant, and you begin to feel better physically. Usually, morning sickness has lessened or quit completely, you may have more energy, and you may have an increase in appetite. The second trimester is also a time when the fetus is growing rapidly. At the end of the sixth month, the fetus is about 9 inches long and weighs about 1 pounds. You will likely begin to feel the baby move (quickening) between 18 and 20 weeks of the pregnancy. BODY CHANGES Your body goes through many changes during pregnancy. The changes vary from woman to woman.   Your weight will continue to increase. You will notice your lower abdomen bulging out.  You may begin to get stretch marks on your hips, abdomen, and breasts.  You may develop headaches that can be relieved by medicines approved by your health care provider.  You may urinate more often because the fetus is pressing on your bladder.  You may develop or continue to have heartburn as a result of your pregnancy.  You may develop constipation because certain hormones are causing the muscles that push waste through your intestines to slow down.  You may develop hemorrhoids or swollen, bulging veins (varicose veins).  You may have back pain because of the weight gain and pregnancy hormones relaxing your joints between the bones in your pelvis and as a result of a shift in weight and the muscles that support your balance.  Your breasts will continue to grow and be tender.  Your gums may bleed and may be sensitive to brushing and flossing.  Dark spots or blotches (chloasma, mask of pregnancy) may develop on your face. This will likely fade after the baby is born.  A dark line from your belly button to the pubic area (linea nigra) may appear. This will likely fade  after the baby is born.  You may have changes in your hair. These can include thickening of your hair, rapid growth, and changes in texture. Some women also have hair loss during or after pregnancy, or hair that feels dry or thin. Your hair will most likely return to normal after your baby is born. WHAT TO EXPECT AT YOUR PRENATAL VISITS During a routine prenatal visit:  You will be weighed to make sure you and the fetus are growing normally.  Your blood pressure will be taken.  Your abdomen will be measured to track your baby's growth.  The fetal heartbeat will be listened to.  Any test results from the previous visit will be discussed. Your health care provider may ask you:  How you are feeling.  If you are feeling the baby move.  If you have had any abnormal symptoms, such as leaking fluid, bleeding, severe headaches, or abdominal cramping.  If you are using any tobacco products, including cigarettes, chewing tobacco, and electronic cigarettes.  If you have any questions. Other tests that may be performed during your second trimester include:  Blood tests that check for:  Low iron levels (anemia).  Gestational diabetes (between 24 and 28 weeks).  Rh antibodies.  Urine tests to check for infections, diabetes, or protein in the urine.  An ultrasound to confirm the proper growth and development of the baby.  An amniocentesis to check for possible genetic problems.  Fetal screens for spina bifida  and Down syndrome.  HIV (human immunodeficiency virus) testing. Routine prenatal testing includes screening for HIV, unless you choose not to have this test. HOME CARE INSTRUCTIONS   Avoid all smoking, herbs, alcohol, and unprescribed drugs. These chemicals affect the formation and growth of the baby.  Do not use any tobacco products, including cigarettes, chewing tobacco, and electronic cigarettes. If you need help quitting, ask your health care provider. You may receive  counseling support and other resources to help you quit.  Follow your health care provider's instructions regarding medicine use. There are medicines that are either safe or unsafe to take during pregnancy.  Exercise only as directed by your health care provider. Experiencing uterine cramps is a good sign to stop exercising.  Continue to eat regular, healthy meals.  Wear a good support bra for breast tenderness.  Do not use hot tubs, steam rooms, or saunas.  Wear your seat belt at all times when driving.  Avoid raw meat, uncooked cheese, cat litter boxes, and soil used by cats. These carry germs that can cause birth defects in the baby.  Take your prenatal vitamins.  Take 1500-2000 mg of calcium daily starting at the 20th week of pregnancy until you deliver your baby.  Try taking a stool softener (if your health care provider approves) if you develop constipation. Eat more high-fiber foods, such as fresh vegetables or fruit and whole grains. Drink plenty of fluids to keep your urine clear or pale yellow.  Take warm sitz baths to soothe any pain or discomfort caused by hemorrhoids. Use hemorrhoid cream if your health care provider approves.  If you develop varicose veins, wear support hose. Elevate your feet for 15 minutes, 3-4 times a day. Limit salt in your diet.  Avoid heavy lifting, wear low heel shoes, and practice good posture.  Rest with your legs elevated if you have leg cramps or low back pain.  Visit your dentist if you have not gone yet during your pregnancy. Use a soft toothbrush to brush your teeth and be gentle when you floss.  A sexual relationship may be continued unless your health care provider directs you otherwise.  Continue to go to all your prenatal visits as directed by your health care provider. SEEK MEDICAL CARE IF:   You have dizziness.  You have mild pelvic cramps, pelvic pressure, or nagging pain in the abdominal area.  You have persistent nausea,  vomiting, or diarrhea.  You have a bad smelling vaginal discharge.  You have pain with urination. SEEK IMMEDIATE MEDICAL CARE IF:   You have a fever.  You are leaking fluid from your vagina.  You have spotting or bleeding from your vagina.  You have severe abdominal cramping or pain.  You have rapid weight gain or loss.  You have shortness of breath with chest pain.  You notice sudden or extreme swelling of your face, hands, ankles, feet, or legs.  You have not felt your baby move in over an hour.  You have severe headaches that do not go away with medicine.  You have vision changes.   This information is not intended to replace advice given to you by your health care provider. Make sure you discuss any questions you have with your health care provider.   Document Released: 06/06/2001 Document Revised: 07/03/2014 Document Reviewed: 08/13/2012 Elsevier Interactive Patient Education 2016 [employer]  Tourist information centre managerMotor Vehicle Collision It is common to have multiple bruises and sore muscles after a motor vehicle collision (MVC). These tend  to feel worse for the first 24 hours. You may have the most stiffness and soreness over the first several hours. You may also feel worse when you wake up the first morning after your collision. After this point, you will usually begin to improve with each day. The speed of improvement often depends on the severity of the collision, the number of injuries, and the location and nature of these injuries. HOME CARE INSTRUCTIONS  Put ice on the injured area.  Put ice in a plastic bag.  Place a towel between your skin and the bag.  Leave the ice on for 15-20 minutes, 3-4 times a day, or as directed by your health care provider.  Drink enough fluids to keep your urine clear or pale yellow. Do not drink alcohol.  Take a warm shower or bath once or twice a day. This will increase blood flow to sore muscles.  You may return to activities as directed by  your caregiver. Be careful when lifting, as this may aggravate neck or back pain.  Only take over-the-counter or prescription medicines for pain, discomfort, or fever as directed by your caregiver. Do not use aspirin. This may increase bruising and bleeding. SEEK IMMEDIATE MEDICAL CARE IF:  You have numbness, tingling, or weakness in the arms or legs.  You develop severe headaches not relieved with medicine.  You have severe neck pain, especially tenderness in the middle of the back of your neck.  You have changes in bowel or bladder control.  There is increasing pain in any area of the body.  You have shortness of breath, light-headedness, dizziness, or fainting.  You have chest pain.  You feel sick to your stomach (nauseous), throw up (vomit), or sweat.  You have increasing abdominal discomfort.  There is blood in your urine, stool, or vomit.  You have pain in your shoulder (shoulder strap areas).  You feel your symptoms are getting worse. MAKE SURE YOU:  Understand these instructions.  Will watch your condition.  Will get help right away if you are not doing well or get worse.   This information is not intended to replace advice given to you by your health care provider. Make sure you discuss any questions you have with your health care provider.   Document Released: 06/12/2005 Document Revised: 07/03/2014 Document Reviewed: 11/09/2010 Elsevier Interactive Patient Education [employer]

## 2016-01-14 NOTE — ED Notes (Signed)
Patients mother requesting her number to be left.  Geroge BasemanLesa Kelley: [phone]: [phone].
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
## 2016-01-18 ENCOUNTER — Encounter: Payer: Self-pay | Admitting: Emergency Medicine

## 2016-01-18 ENCOUNTER — Emergency Department
Admission: EM | Admit: 2016-01-18 | Discharge: 2016-01-18 | Disposition: A | Discharge status: Home / Self Care | Payer: Medicaid Other | Attending: Emergency Medicine | Admitting: Emergency Medicine

## 2016-01-18 DIAGNOSIS — F17210 Nicotine dependence, cigarettes, uncomplicated: Secondary | ICD-10-CM | POA: Insufficient documentation

## 2016-01-18 DIAGNOSIS — O219 Vomiting of pregnancy, unspecified: Secondary | ICD-10-CM | POA: Diagnosis not present

## 2016-01-18 DIAGNOSIS — R112 Nausea with vomiting, unspecified: Secondary | ICD-10-CM | POA: Diagnosis present

## 2016-01-18 DIAGNOSIS — Z3A20 20 weeks gestation of pregnancy: Secondary | ICD-10-CM | POA: Insufficient documentation

## 2016-01-18 DIAGNOSIS — Z79899 Other long term (current) drug therapy: Secondary | ICD-10-CM | POA: Diagnosis not present

## 2016-01-18 DIAGNOSIS — Z792 Long term (current) use of antibiotics: Secondary | ICD-10-CM | POA: Diagnosis not present

## 2016-01-18 LAB — CBC
HEMATOCRIT: 32.3 % — AB (ref 35.0–47.0)
HEMOGLOBIN: 11.7 g/dL — AB (ref 12.0–16.0)
MCH: 31.8 pg (ref 26.0–34.0)
MCHC: 36.2 g/dL — ABNORMAL HIGH (ref 32.0–36.0)
MCV: 87.7 fL (ref 80.0–100.0)
Platelets: 183 10*3/uL (ref 150–440)
RBC: 3.69 MIL/uL — AB (ref 3.80–5.20)
RDW: 12.9 % (ref 11.5–14.5)
WBC: 7.6 10*3/uL (ref 3.6–11.0)

## 2016-01-18 LAB — COMPREHENSIVE METABOLIC PANEL
ALBUMIN: 3.2 g/dL — AB (ref 3.5–5.0)
ALT: 11 U/L — ABNORMAL LOW (ref 14–54)
ANION GAP: 7 (ref 5–15)
AST: 15 U/L (ref 15–41)
Alkaline Phosphatase: 72 U/L (ref 38–126)
BILIRUBIN TOTAL: 1.2 mg/dL (ref 0.3–1.2)
BUN: 12 mg/dL (ref 6–20)
CO2: 22 mmol/L (ref 22–32)
Calcium: 9 mg/dL (ref 8.9–10.3)
Chloride: 107 mmol/L (ref 101–111)
Creatinine, Ser: 0.38 mg/dL — ABNORMAL LOW (ref 0.44–1.00)
Glucose, Bld: 84 mg/dL (ref 65–99)
POTASSIUM: 3.4 mmol/L — AB (ref 3.5–5.1)
Sodium: 136 mmol/L (ref 135–145)
TOTAL PROTEIN: 6.8 g/dL (ref 6.5–8.1)

## 2016-01-18 LAB — URINALYSIS COMPLETE WITH MICROSCOPIC (ARMC ONLY)
Bilirubin Urine: NEGATIVE
Glucose, UA: NEGATIVE mg/dL
NITRITE: NEGATIVE
PROTEIN: 30 mg/dL — AB
SPECIFIC GRAVITY, URINE: 1.026 (ref 1.005–1.030)
pH: 5 (ref 5.0–8.0)

## 2016-01-18 LAB — LIPASE, BLOOD: Lipase: 15 U/L (ref 11–51)

## 2016-01-18 MED ORDER — METOCLOPRAMIDE HCL 10 MG PO TABS: 10.0000 mg | ORAL_TABLET | Freq: Three times a day (TID) | ORAL | 0 refills | Status: DC | PRN

## 2016-01-18 NOTE — ED Notes (Signed)
Pt discharged to home.  Discharge instructions reviewed.  Verbalized understanding.  No questions or concerns at this time.  Teach back verified.  Pt in NAD.  No items left in ED.   

## 2016-01-18 NOTE — ED Provider Notes (Signed)
[facility] Emergency Department Provider Note    ____________________________________________   I have reviewed the triage vital signs and the nursing notes.   HISTORY  Chief Complaint Emesis (with pregnancy)   History limited by: Not Limited   HPI Darlene Kelley is a 20 y.o. female at roughly [redacted] weeks pregnant who presents to the emergency department today because of concern for nausea and vomiting. The patient developed these symptoms yesterday. She has had decreased PO intake. Additionally she has noticed some bad odor to her urine recently although has not had any dysuria. Denies any fevers. Denies any diarrhea. No abdominal pain.    History reviewed. No pertinent past medical history.  There are no active problems to display for this patient.   History reviewed. No pertinent surgical history.  Current Outpatient Rx  . Order #: [id number] Class: Print  . Order #: [id number] Class: Print  . Order #: [id number] Class: Print  . Order #: [id number] Class: Print  . Order #: [id number] Class: Print  . Order #: [id number] Class: Historical Med    Allergies Review of patient's allergies indicates no known allergies.  No family history on file.  Social History Social History  Substance Use Topics  . Smoking status: Current Every Day Smoker    Packs/day: 0.00    Types: Cigarettes  . Smokeless tobacco: Not on file  . Alcohol use No     Comment: Occasional    Review of Systems  Constitutional: Negative for fever. Cardiovascular: Negative for chest pain. Respiratory: Negative for shortness of breath. Gastrointestinal: Negative for abdominal pain or diarrhea. Positive for nausea and vomiting. Genitourinary: Positive for bad odor to her urine. Neurological: Negative for headaches, focal weakness or numbness.  10-point ROS otherwise negative.  ____________________________________________   PHYSICAL EXAM:  VITAL SIGNS: ED Triage Vitals [01/18/16  1054]  Enc Vitals Group     BP (!) 120/58     Pulse Rate (!) 111     Resp 16     Temp 98.2 F (36.8 C)     Temp Source Oral     SpO2 98 %     Weight 113 lb (51.3 kg)     Height 5\' 6"  (1.676 m)   Constitutional: Alert and oriented. Well appearing and in no distress. Eyes: Conjunctivae are normal. PERRL. Normal extraocular movements. ENT   Head: Normocephalic and atraumatic.   Nose: No congestion/rhinnorhea.   Mouth/Throat: Mucous membranes are moist.   Neck: No stridor. Hematological/Lymphatic/Immunilogical: No cervical lymphadenopathy. Cardiovascular: Normal rate, regular rhythm.  No murmurs, rubs, or gallops. Respiratory: Normal respiratory effort without tachypnea nor retractions. Breath sounds are clear and equal bilaterally. No wheezes/rales/rhonchi. Gastrointestinal: Soft and nontender. Gravid. No distention. There is no CVA tenderness. Genitourinary: Deferred Musculoskeletal: Normal range of motion in all extremities. No joint effusions.  No lower extremity tenderness nor edema. Neurologic:  Normal speech and language. No gross focal neurologic deficits are appreciated.  Skin:  Skin is warm, dry and intact. No rash noted. Psychiatric: Mood and affect are normal. Speech and behavior are normal. Patient exhibits appropriate insight and judgment.  ____________________________________________    LABS (pertinent positives/negatives)  Labs Reviewed  COMPREHENSIVE METABOLIC PANEL - Abnormal; Notable for the following:       Result Value   Potassium 3.4 (*)    Creatinine, Ser 0.38 (*)    Albumin 3.2 (*)    ALT 11 (*)    All other components within normal limits  CBC - Abnormal; Notable for the following:  RBC 3.69 (*)    Hemoglobin 11.7 (*)    HCT 32.3 (*)    MCHC 36.2 (*)    All other components within normal limits  URINALYSIS COMPLETEWITH MICROSCOPIC ([facility] ONLY) - Abnormal; Notable for the following:    Color, Urine AMBER (*)    APPearance CLEAR (*)     Ketones, ur TRACE (*)    Hgb urine dipstick 1+ (*)    Protein, ur 30 (*)    Leukocytes, UA TRACE (*)    Bacteria, UA RARE (*)    Squamous Epithelial / LPF 0-5 (*)    All other components within normal limits  LIPASE, BLOOD     ____________________________________________   EKG  None  ____________________________________________    RADIOLOGY  None   ____________________________________________   PROCEDURES  Procedures  ____________________________________________   INITIAL IMPRESSION / ASSESSMENT AND PLAN / ED COURSE  Pertinent labs & imaging results that were available during my care of the patient were reviewed by me and considered in my medical decision making (see chart for details).  Patient presented to the emergency department today because of 2 day history of nausea and vomiting. Blood work with very minimally decreased potassium (3.4), otherwise without any concerning findings. No WBCs in urine to suggest UTI. Will plan on giving prescription for antiemetics.  ____________________________________________   FINAL CLINICAL IMPRESSION(S) / ED DIAGNOSES  Final diagnoses:  Nausea and vomiting in pregnancy     Note: This dictation was prepared with Dragon dictation. Any transcriptional errors that result from this process are unintentional    Darlene Semen, MD 01/18/16 1459

## 2016-01-18 NOTE — ED Notes (Signed)
Pt reports emesis x 4 since yesterday. States she has been unable to eat or drink without vomiting. Pt states she called her OB-GYN and was unable to get an appt. NAD noted.

## 2016-01-18 NOTE — ED Triage Notes (Signed)
Pt reports vomiting, pt is [redacted] weeks pregnant; sees [facility]. Pt denies abdominal pain.

## 2016-01-18 NOTE — Discharge Instructions (Signed)
Please seek medical attention for any high fevers, chest pain, shortness of breath, change in behavior, persistent vomiting, bloody stool or any other new or concerning symptoms.  

## 2016-02-24 ENCOUNTER — Emergency Department
Admission: EM | Admit: 2016-02-24 | Discharge: 2016-02-25 | Disposition: A | Discharge status: Home / Self Care | Payer: Medicaid Other | Attending: Emergency Medicine | Admitting: Emergency Medicine

## 2016-02-24 ENCOUNTER — Encounter: Payer: Self-pay | Admitting: Emergency Medicine

## 2016-02-24 DIAGNOSIS — O99612 Diseases of the digestive system complicating pregnancy, second trimester: Secondary | ICD-10-CM | POA: Diagnosis present

## 2016-02-24 DIAGNOSIS — Z79899 Other long term (current) drug therapy: Secondary | ICD-10-CM | POA: Insufficient documentation

## 2016-02-24 DIAGNOSIS — F17210 Nicotine dependence, cigarettes, uncomplicated: Secondary | ICD-10-CM | POA: Insufficient documentation

## 2016-02-24 DIAGNOSIS — Z3A24 24 weeks gestation of pregnancy: Secondary | ICD-10-CM | POA: Insufficient documentation

## 2016-02-24 DIAGNOSIS — K0889 Other specified disorders of teeth and supporting structures: Secondary | ICD-10-CM

## 2016-02-24 MED ORDER — MORPHINE SULFATE (PF) 4 MG/ML IV SOLN
4.0000 mg | Freq: Once | INTRAVENOUS | Status: AC
  Administered 2016-02-24: 4 mg via INTRAMUSCULAR

## 2016-02-24 MED ORDER — MORPHINE SULFATE (PF) 4 MG/ML IV SOLN
INTRAVENOUS | Status: AC
  Administered 2016-02-24: 4 mg via INTRAMUSCULAR
  Filled 2016-02-24: qty 1

## 2016-02-24 NOTE — ED Provider Notes (Signed)
[facility] Emergency Department Provider Note  ____________________________________________  Time seen: Approximately 12:21 AM  I have reviewed the triage vital signs and the nursing notes.   HISTORY  Chief Complaint Dental Pain    HPI Darlene Kelley is a 20 y.o. female who presents with increasing pain to the right mouth and jaw this week. She had a root canal performed on Monday, August 28 and has had increasing pain to the area. No signs of swelling. She is currently on amoxicillin. No fevers or chills. No congestion. Pain referred to the right ear as well. Pain worse with chewing. Route canal performed on her lower posterior molar.She is 6 months pregnant.   History reviewed. No pertinent past medical history.  There are no active problems to display for this patient.   History reviewed. No pertinent surgical history.  Current Outpatient Rx  . Order #: [id number] Class: Print  . Order #: [id number] Class: Print  . Order #: [id number] Class: Print  . Order #: [id number] Class: Print  . Order #: [id number] Class: Print  . Order #: [id number] Class: Print  . Order #: [id number] Class: Historical Med    Allergies Review of patient's allergies indicates no known allergies.  No family history on file.  Social History Social History  Substance Use Topics  . Smoking status: Current Every Day Smoker    Packs/day: 0.00    Types: Cigarettes  . Smokeless tobacco: Never Used  . Alcohol use No     Comment: Occasional    Review of Systems Constitutional: No fever/chills Eyes: No visual changes. ENT: No sore throat. Cardiovascular: Denies chest pain. Respiratory: Denies shortness of breath. Musculoskeletal: Negative for back pain. Skin: Negative for rash. Neurological: Negative for headaches, focal weakness or numbness. 10-point ROS otherwise negative.  ____________________________________________   PHYSICAL EXAM:  VITAL SIGNS: ED Triage Vitals  Enc  Vitals Group     BP 02/24/16 2315 128/74     Pulse Rate 02/24/16 2315 (!) 109     Resp 02/24/16 2315 20     Temp 02/24/16 2315 98.2 F (36.8 C)     Temp Source 02/24/16 2315 Oral     SpO2 02/24/16 2315 99 %     Weight 02/24/16 2232 115 lb (52.2 kg)     Height 02/24/16 2232 5\' 6"  (1.676 m)     Head Circumference --      Peak Flow --      Pain Score 02/24/16 2232 10     Pain Loc --      Pain Edu? --      Excl. in GC? --     Constitutional: Alert and oriented and in Moderate to severe acute distress, crying. Eyes: Conjunctivae are normal. PERRL. EOMI. Head: Atraumatic. Nose: No congestion/rhinnorhea. Mouth/Throat: Mucous membranes are moist.  Oropharynx non-erythematous. No lesions.No abscess noted or acute dental dental tenderness. No swelling.  Neck:  Supple.  No adenopathy.   Cardiovascular: Normal rate, regular rhythm. Grossly normal heart sounds.  Good peripheral circulation. Respiratory: Normal respiratory effort.  No retractions. Lungs CTAB.  Musculoskeletal: Nml ROM of upper and lower extremity joints. Neurologic:  Normal speech and language. No gross focal neurologic deficits are appreciated. No gait instability. Skin:  Skin is warm, dry and intact. No rash noted. Psychiatric: Mood and affect are normal. Speech and behavior are normal.  ____________________________________________   LABS (all labs ordered are listed, but only abnormal results are displayed)  Labs Reviewed - No data to display ____________________________________________  EKG   ____________________________________________  RADIOLOGY   ____________________________________________   PROCEDURES  Procedure(s) performed: Dental block performed with lidocaine to the inferior alveolar nerve on the right.  Pt tolerated procedure well. No complications.  Critical Care performed: No  ____________________________________________   INITIAL IMPRESSION / ASSESSMENT AND PLAN / ED COURSE  Pertinent  labs & imaging results that were available during my care of the patient were reviewed by me and considered in my medical decision making (see chart for details).  20 year old with increasing pain to the area around the right lower molar radiating towards the right ear. She is currently on antibiotics, amoxicillin. No signs of abscess on exam. Initially giving morphine IM 4 mg without any benefit. A dental block performed to the inferior alveolar nerve, with some success though not complete resolution. She is given 1 dose of prednisone 60 mg by mouth, and 1 Percocet. She will contact the dentist in the morning for further evaluation. ____________________________________________   FINAL CLINICAL IMPRESSION(S) / ED DIAGNOSES  Final diagnoses:  Pain, dental      Ignacia Bayley, PA-C 02/25/16 0035    Nita Sickle, MD 02/25/16 1341

## 2016-02-24 NOTE — ED Triage Notes (Signed)
Patient ambulatory to triage with steady gait, without difficulty or distress noted; pt reports had root canal on Monday and rx antibiotic with instructions to take tylenol; st pain unrelieved by tylenol pt 6mos pregnant

## 2016-02-25 MED ORDER — OXYCODONE-ACETAMINOPHEN 5-325 MG PO TABS
1.0000 | ORAL_TABLET | Freq: Once | ORAL | Status: AC
  Administered 2016-02-25: 1 via ORAL
  Filled 2016-02-25: qty 1

## 2016-02-25 MED ORDER — LIDOCAINE-EPINEPHRINE 2 %-1:100000 IJ SOLN
1.7000 mL | Freq: Once | INTRAMUSCULAR | Status: AC
  Administered 2016-02-25: 1.7 mL via INTRADERMAL
  Filled 2016-02-25: qty 1.7

## 2016-02-25 MED ORDER — PREDNISONE 20 MG PO TABS
60.0000 mg | ORAL_TABLET | Freq: Once | ORAL | Status: AC
  Administered 2016-02-25: 60 mg via ORAL
  Filled 2016-02-25: qty 3

## 2016-02-25 NOTE — Discharge Instructions (Signed)
Contact your dentist tomorrow, for further evaluation. Return to the emergency room for any new or worsening symptoms.

## 2016-02-25 NOTE — ED Notes (Signed)
Discharge instructions reviewed with patient. Questions fielded by this RN. Patient verbalizes understanding of instructions. Patient discharged home in stable condition per [place] PA. No acute distress noted at time of discharge.   

## 2016-02-25 NOTE — ED Notes (Signed)
Pt states the bottom is getting some better but the top is unbearable. PA at the bedside.

## 2016-03-08 IMAGING — US US TRANSVAGINAL NON-OB
1 series · 13 of 25 positions shown · non-contrast
Comparison: Ultrasound May 13, 2014.

CLINICAL DATA: Dysfunctional uterine bleeding.

EXAM:
TRANSABDOMINAL AND TRANSVAGINAL ULTRASOUND OF PELVIS
TECHNIQUE: Both transabdominal and transvaginal ultrasound examinations of the
pelvis were performed. Transabdominal technique was performed for
global imaging of the pelvis including uterus, ovaries, adnexal
regions, and pelvic cul-de-sac. It was necessary to proceed with
endovaginal exam following the transabdominal exam to visualize the
endometrium and ovaries.

[Series 1: us transvaginal non-ob · 0.15mm/px · 13 of 188 slices shown]
[im 1/188]
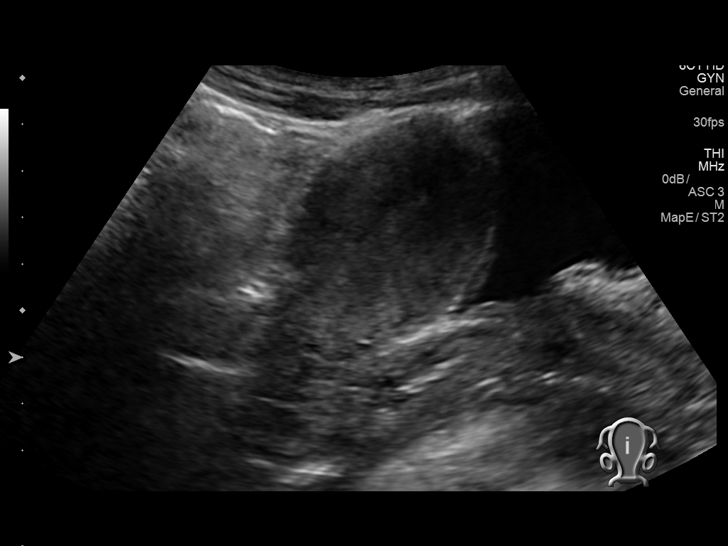
[im 16/188]
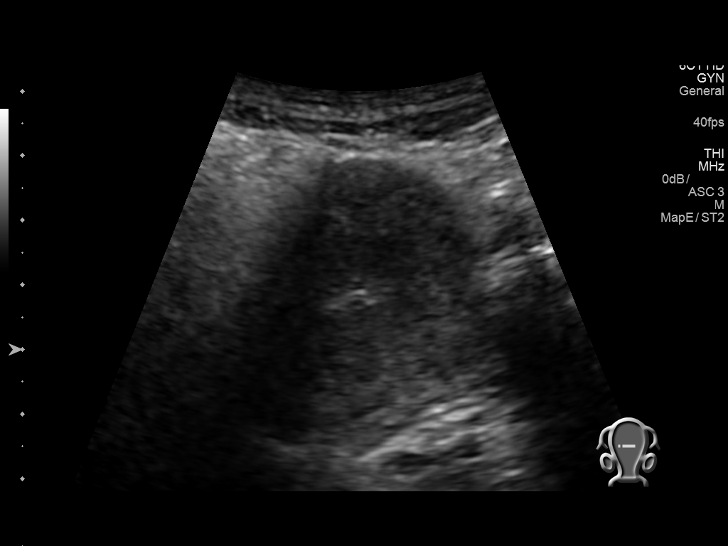
[im 32/188]
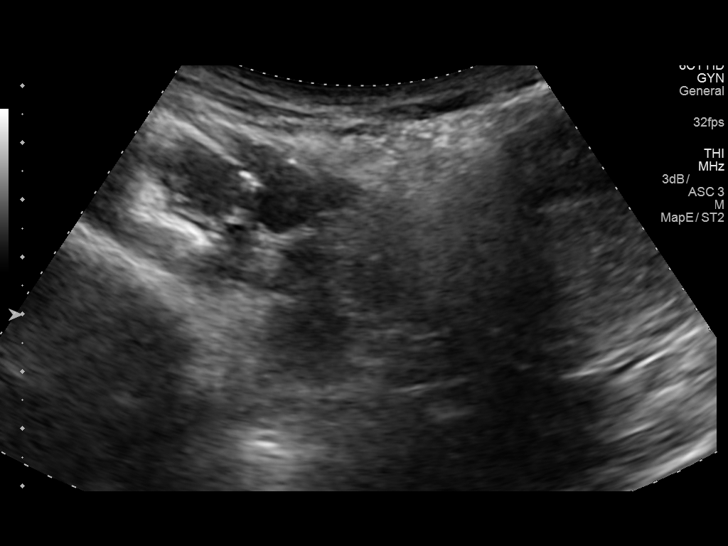
[im 47/188]
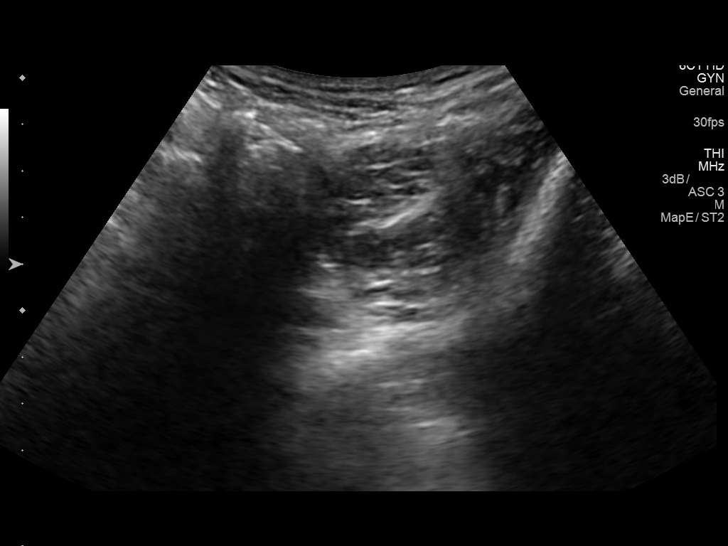
[im 63/188]
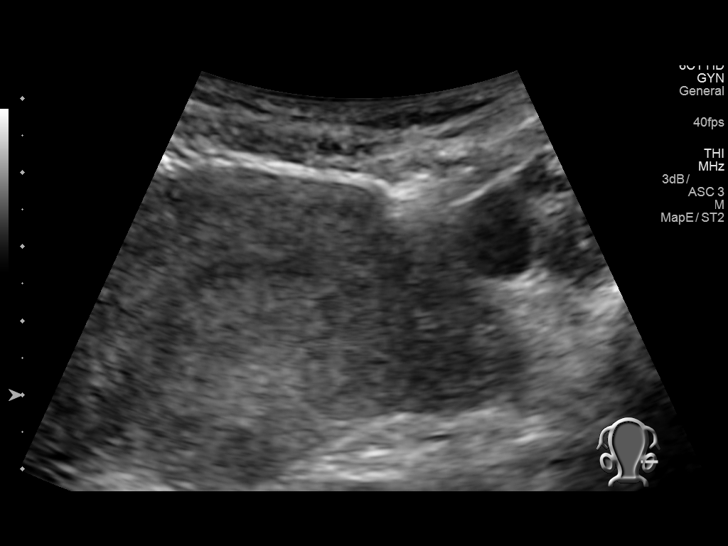
[im 78/188]
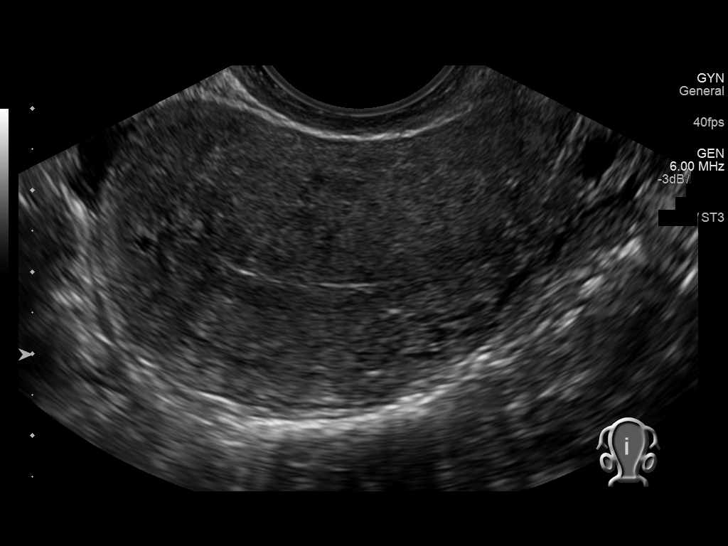
[im 94/188]
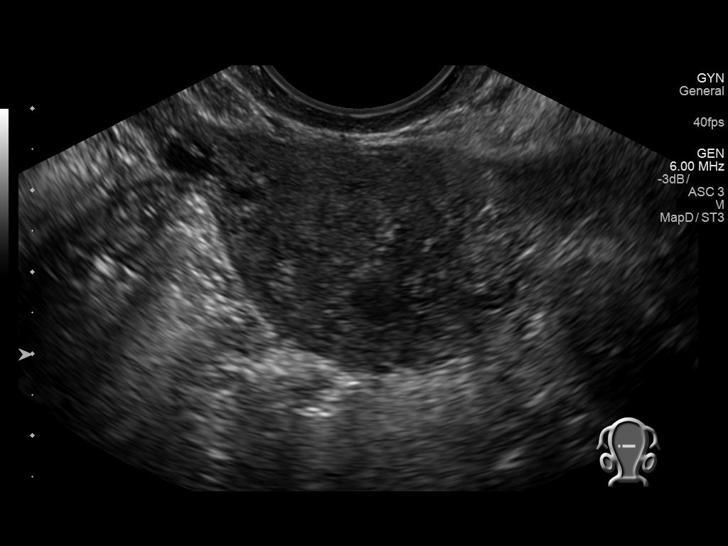
[im 110/188]
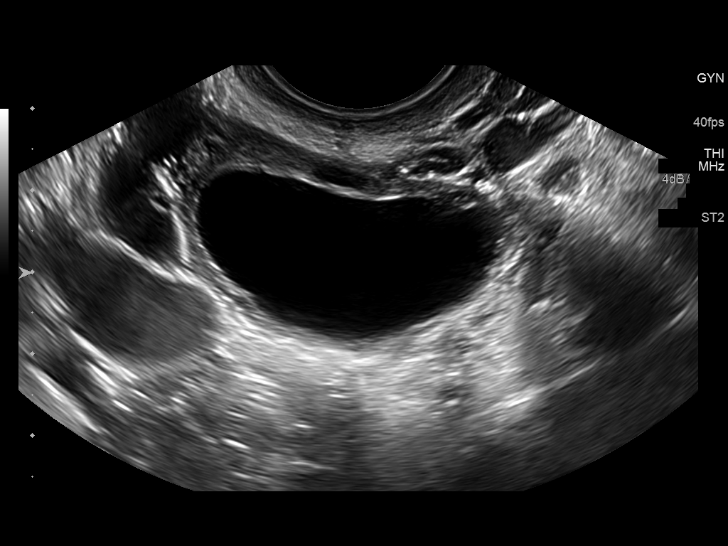
[im 125/188]
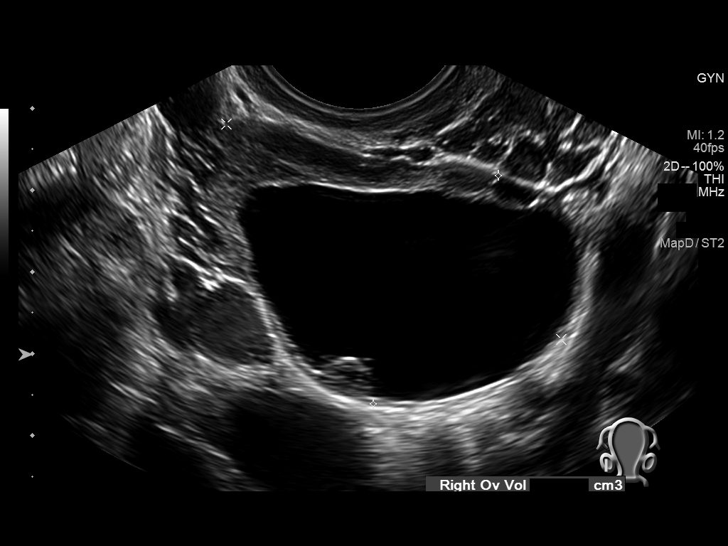
[im 141/188]
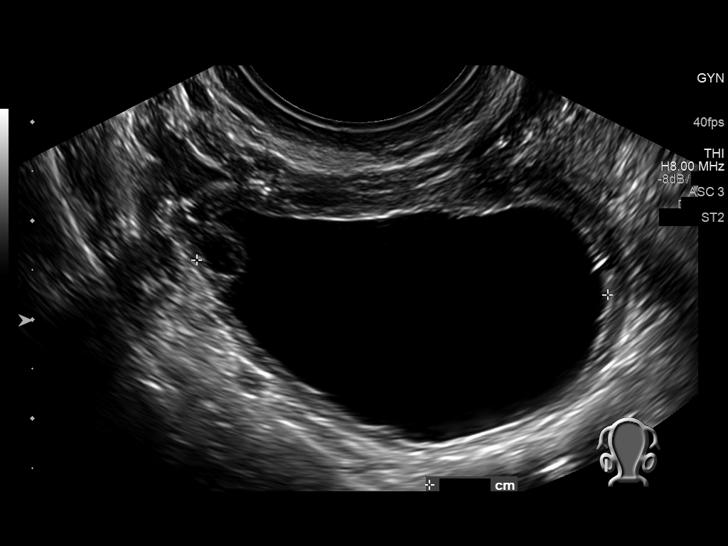
[im 156/188]
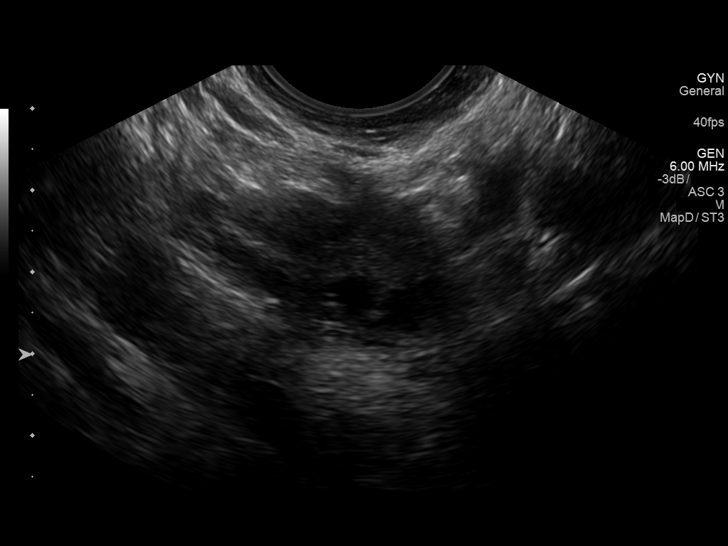
[im 172/188]
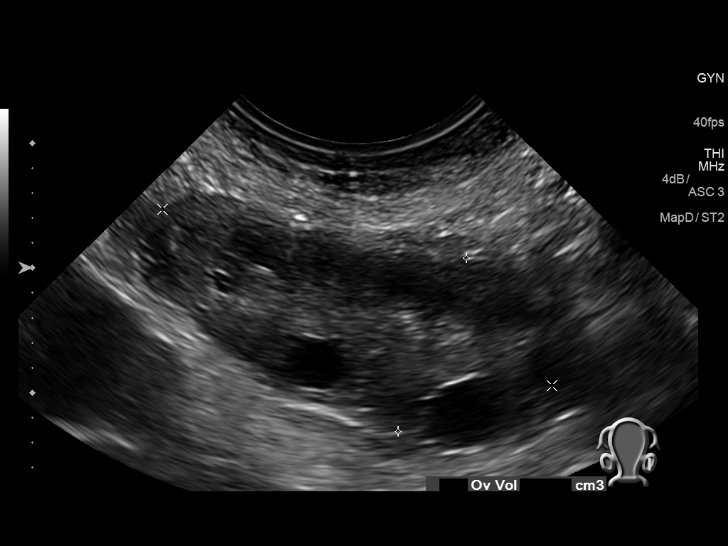
[im 188/188]
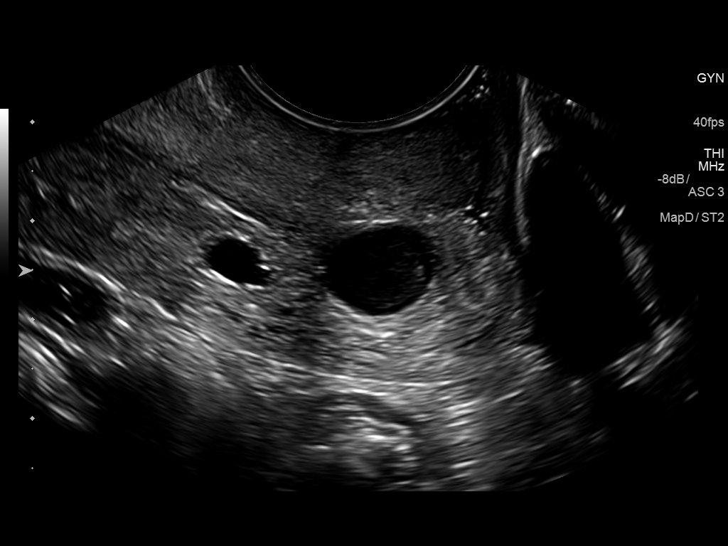

[13 of 25 positions shown; findings below may reference images not displayed]

FINDINGS: Uterus

Measurements: 8.1 x 4.0 x 3.7 cm. No fibroids or other mass
visualized.

Endometrium

Thickness: 11 mm which is within normal limits for patient of
reproductive age. No focal abnormality visualized.

Right ovary

Measurements: 4.9 x 4.2 x 3.2 cm. Cystic lesion measuring 4.3 x
x 2.4 cm is noted with multiple smaller peripheral cysts.

Left ovary

Measurements: 3.4 x 2.1 x 1.5 cm. Normal appearance/no adnexal mass.

Other findings

Trace free fluid is noted which most likely is physiologic.
IMPRESSION: 4.3 cm mildly complex cyst is noted in right ovary. Followup
ultrasound in 6-12 weeks is recommended to ensure resolution or
stability of this abnormality and rule out possibility of cystic
neoplasm.

## 2016-04-27 LAB — OB RESULTS CONSOLE GBS: STREP GROUP B AG: NEGATIVE

## 2016-05-28 ENCOUNTER — Inpatient Hospital Stay: Payer: Medicaid Other | Admitting: Anesthesiology

## 2016-05-28 ENCOUNTER — Inpatient Hospital Stay
Admission: EM | Admit: 2016-05-28 | Discharge: 2016-05-30 | DRG: 775 | Disposition: A | Discharge status: Home / Self Care | Payer: Medicaid Other | Attending: Obstetrics and Gynecology | Admitting: Obstetrics and Gynecology

## 2016-05-28 ENCOUNTER — Encounter: Payer: Self-pay | Admitting: *Deleted

## 2016-05-28 DIAGNOSIS — Z3A39 39 weeks gestation of pregnancy: Secondary | ICD-10-CM

## 2016-05-28 DIAGNOSIS — Z6791 Unspecified blood type, Rh negative: Secondary | ICD-10-CM

## 2016-05-28 DIAGNOSIS — O26893 Other specified pregnancy related conditions, third trimester: Secondary | ICD-10-CM | POA: Diagnosis present

## 2016-05-28 DIAGNOSIS — Z3493 Encounter for supervision of normal pregnancy, unspecified, third trimester: Secondary | ICD-10-CM | POA: Diagnosis present

## 2016-05-28 DIAGNOSIS — O4202 Full-term premature rupture of membranes, onset of labor within 24 hours of rupture: Principal | ICD-10-CM | POA: Diagnosis present

## 2016-05-28 DIAGNOSIS — Z3480 Encounter for supervision of other normal pregnancy, unspecified trimester: Secondary | ICD-10-CM

## 2016-05-28 DIAGNOSIS — F319 Bipolar disorder, unspecified: Secondary | ICD-10-CM | POA: Diagnosis present

## 2016-05-28 DIAGNOSIS — O99344 Other mental disorders complicating childbirth: Secondary | ICD-10-CM | POA: Diagnosis present

## 2016-05-28 DIAGNOSIS — O4290 Premature rupture of membranes, unspecified as to length of time between rupture and onset of labor, unspecified weeks of gestation: Secondary | ICD-10-CM | POA: Diagnosis present

## 2016-05-28 DIAGNOSIS — K219 Gastro-esophageal reflux disease without esophagitis: Secondary | ICD-10-CM | POA: Diagnosis present

## 2016-05-28 DIAGNOSIS — O9902 Anemia complicating childbirth: Secondary | ICD-10-CM | POA: Diagnosis present

## 2016-05-28 DIAGNOSIS — O9962 Diseases of the digestive system complicating childbirth: Secondary | ICD-10-CM | POA: Diagnosis present

## 2016-05-28 DIAGNOSIS — D500 Iron deficiency anemia secondary to blood loss (chronic): Secondary | ICD-10-CM | POA: Diagnosis present

## 2016-05-28 HISTORY — DX: Gastro-esophageal reflux disease without esophagitis: K21.9

## 2016-05-28 LAB — URINE DRUG SCREEN, QUALITATIVE (ARMC ONLY)
Amphetamines, Ur Screen: NOT DETECTED
BARBITURATES, UR SCREEN: NOT DETECTED
Benzodiazepine, Ur Scrn: NOT DETECTED
COCAINE METABOLITE, UR ~~LOC~~: NOT DETECTED
Cannabinoid 50 Ng, Ur ~~LOC~~: NOT DETECTED
MDMA (Ecstasy)Ur Screen: NOT DETECTED
METHADONE SCREEN, URINE: NOT DETECTED
OPIATE, UR SCREEN: NOT DETECTED
Phencyclidine (PCP) Ur S: NOT DETECTED
Tricyclic, Ur Screen: NOT DETECTED

## 2016-05-28 LAB — CBC
HCT: 32.4 % — ABNORMAL LOW (ref 35.0–47.0)
HEMOGLOBIN: 11 g/dL — AB (ref 12.0–16.0)
MCH: 28.5 pg (ref 26.0–34.0)
MCHC: 34.1 g/dL (ref 32.0–36.0)
MCV: 83.8 fL (ref 80.0–100.0)
Platelets: 177 10*3/uL (ref 150–440)
RBC: 3.86 MIL/uL (ref 3.80–5.20)
RDW: 13.4 % (ref 11.5–14.5)
WBC: 14.5 10*3/uL — ABNORMAL HIGH (ref 3.6–11.0)

## 2016-05-28 LAB — TYPE AND SCREEN
ABO/RH(D): O NEG
Antibody Screen: NEGATIVE

## 2016-05-28 LAB — CHLAMYDIA/NGC RT PCR (ARMC ONLY)
Chlamydia Tr: NOT DETECTED
N GONORRHOEAE: NOT DETECTED

## 2016-05-28 LAB — RAPID HIV SCREEN (HIV 1/2 AB+AG)
HIV 1/2 Antibodies: NONREACTIVE
HIV-1 P24 ANTIGEN - HIV24: NONREACTIVE

## 2016-05-28 MED ORDER — OXYCODONE-ACETAMINOPHEN 5-325 MG PO TABS: 2.0000 | ORAL_TABLET | ORAL | Status: DC | PRN

## 2016-05-28 MED ORDER — OXYTOCIN 10 UNIT/ML IJ SOLN
INTRAMUSCULAR | Status: AC
  Filled 2016-05-28: qty 2

## 2016-05-28 MED ORDER — LACTATED RINGERS IV SOLN: 500.0000 mL | Freq: Once | INTRAVENOUS | Status: DC

## 2016-05-28 MED ORDER — MISOPROSTOL 200 MCG PO TABS
ORAL_TABLET | ORAL | Status: AC
  Filled 2016-05-28: qty 4

## 2016-05-28 MED ORDER — EPHEDRINE 5 MG/ML INJ
10.0000 mg | INTRAVENOUS | Status: DC | PRN
  Filled 2016-05-28: qty 2

## 2016-05-28 MED ORDER — FENTANYL 2.5 MCG/ML W/ROPIVACAINE 0.2% IN NS 100 ML EPIDURAL INFUSION (ARMC-ANES)
EPIDURAL | Status: DC | PRN
  Administered 2016-05-28: 10 mL/h via EPIDURAL

## 2016-05-28 MED ORDER — ACETAMINOPHEN 325 MG PO TABS: 650.0000 mg | ORAL_TABLET | ORAL | Status: DC | PRN

## 2016-05-28 MED ORDER — OXYTOCIN BOLUS FROM INFUSION: 500.0000 mL | Freq: Once | INTRAVENOUS | Status: DC

## 2016-05-28 MED ORDER — DIPHENHYDRAMINE HCL 50 MG/ML IJ SOLN: 12.5000 mg | INTRAMUSCULAR | Status: DC | PRN

## 2016-05-28 MED ORDER — PHENYLEPHRINE 40 MCG/ML (10ML) SYRINGE FOR IV PUSH (FOR BLOOD PRESSURE SUPPORT)
80.0000 ug | PREFILLED_SYRINGE | INTRAVENOUS | Status: DC | PRN
Start: 2016-05-28 — End: 2016-05-29
  Filled 2016-05-28: qty 5

## 2016-05-28 MED ORDER — LIDOCAINE HCL (PF) 2 % IJ SOLN
INTRAMUSCULAR | Status: DC | PRN
  Administered 2016-05-28: 4 mL via INTRADERMAL

## 2016-05-28 MED ORDER — OXYTOCIN 40 UNITS IN LACTATED RINGERS INFUSION - SIMPLE MED
1.0000 m[IU]/min | INTRAVENOUS | Status: DC
  Administered 2016-05-28: 1 m[IU]/min via INTRAVENOUS

## 2016-05-28 MED ORDER — ONDANSETRON HCL 4 MG/2ML IJ SOLN: 4.0000 mg | Freq: Four times a day (QID) | INTRAMUSCULAR | Status: DC | PRN

## 2016-05-28 MED ORDER — LIDOCAINE HCL (PF) 1 % IJ SOLN: 30.0000 mL | INTRAMUSCULAR | Status: DC | PRN

## 2016-05-28 MED ORDER — BUPIVACAINE HCL (PF) 0.25 % IJ SOLN
INTRAMUSCULAR | Status: DC | PRN
  Administered 2016-05-28: 10 mL via EPIDURAL

## 2016-05-28 MED ORDER — AMMONIA AROMATIC IN INHA
RESPIRATORY_TRACT | Status: AC
  Filled 2016-05-28: qty 10

## 2016-05-28 MED ORDER — TERBUTALINE SULFATE 1 MG/ML IJ SOLN: 0.2500 mg | Freq: Once | INTRAMUSCULAR | Status: DC | PRN

## 2016-05-28 MED ORDER — OXYTOCIN 40 UNITS IN LACTATED RINGERS INFUSION - SIMPLE MED
INTRAVENOUS | Status: AC
  Administered 2016-05-28: 1 m[IU]/min via INTRAVENOUS
  Filled 2016-05-28: qty 1000

## 2016-05-28 MED ORDER — LIDOCAINE HCL (PF) 1 % IJ SOLN
INTRAMUSCULAR | Status: AC
  Filled 2016-05-28: qty 30

## 2016-05-28 MED ORDER — LACTATED RINGERS IV SOLN
INTRAVENOUS | Status: DC
  Administered 2016-05-28 (×2): via INTRAVENOUS

## 2016-05-28 MED ORDER — PHENYLEPHRINE 40 MCG/ML (10ML) SYRINGE FOR IV PUSH (FOR BLOOD PRESSURE SUPPORT)
80.0000 ug | PREFILLED_SYRINGE | INTRAVENOUS | Status: DC | PRN
  Filled 2016-05-28: qty 5

## 2016-05-28 MED ORDER — SOD CITRATE-CITRIC ACID 500-334 MG/5ML PO SOLN: 30.0000 mL | ORAL | Status: DC | PRN

## 2016-05-28 MED ORDER — LIDOCAINE-EPINEPHRINE (PF) 1.5 %-1:200000 IJ SOLN
INTRAMUSCULAR | Status: DC | PRN
  Administered 2016-05-28: 3 mL via PERINEURAL

## 2016-05-28 MED ORDER — OXYTOCIN 40 UNITS IN LACTATED RINGERS INFUSION - SIMPLE MED: 2.5000 [IU]/h | INTRAVENOUS | Status: DC

## 2016-05-28 MED ORDER — LACTATED RINGERS IV SOLN: 500.0000 mL | INTRAVENOUS | Status: DC | PRN

## 2016-05-28 MED ORDER — OXYCODONE-ACETAMINOPHEN 5-325 MG PO TABS: 1.0000 | ORAL_TABLET | ORAL | Status: DC | PRN

## 2016-05-28 MED ORDER — LIDOCAINE HCL (PF) 1 % IJ SOLN
INTRAMUSCULAR | Status: DC | PRN
  Administered 2016-05-28: 3 mL

## 2016-05-28 MED ORDER — FENTANYL 2.5 MCG/ML W/ROPIVACAINE 0.2% IN NS 100 ML EPIDURAL INFUSION (ARMC-ANES)
EPIDURAL | Status: AC
  Filled 2016-05-28: qty 100

## 2016-05-28 MED ORDER — BUTORPHANOL TARTRATE 1 MG/ML IJ SOLN: 1.0000 mg | INTRAMUSCULAR | Status: DC | PRN

## 2016-05-28 MED ORDER — FENTANYL 2.5 MCG/ML W/ROPIVACAINE 0.2% IN NS 100 ML EPIDURAL INFUSION (ARMC-ANES): 10.0000 mL/h | EPIDURAL | Status: DC

## 2016-05-28 NOTE — Progress Notes (Signed)
Jen MowKesha C Utsey is a 20 y.o. [phone] at 3149w6d in active labor with 3 of pit. PROM?24 hr ago.  Subjective: S/p epidural, comfortable with pressure  Objective: BP (!) 96/45   Pulse 99   Temp 97.9 F (36.6 C) (Oral)   Resp 18   Ht 5\' 4"  (1.626 m)   Wt 138 lb (62.6 kg)   LMP 08/23/2015   SpO2 100%   BMI 23.69 kg/m  No intake/output data recorded. Total I/O In: 1289.6 [I.V.:1289.6] Out: -   FHT:  FHR: 140 bpm, variability: moderate,  accelerations:  Present,  decelerations:  Present variables UC:   regular, every 4-5 minutes SVE:   Dilation: 5.5 Effacement (%): 80 Station: 0 Exam by:: [facility] RN  Labs: Lab Results  Component Value Date   WBC 14.5 (H) 05/28/2016   HGB 11.0 (L) 05/28/2016   HCT 32.4 (L) 05/28/2016   MCV 83.8 05/28/2016   PLT 177 05/28/2016    Assessment / Plan: Augmentation of labor, progressing well  Labor: Progressing on Pitocin, will continue to increase PRN protocol Fetal Wellbeing:  Category II Pain Control:  Epidural I/D:  n/a - leukocytosis, afrbile, no pain in uterus Anticipated MOD:  NSVD   Acute on chronic blood loss anemia  Filip Luten 05/28/2016, 10:24 PM

## 2016-05-28 NOTE — OB Triage Note (Signed)
C/O watery/mucous vaginal discharge. Started last night around 2300. Denies any gush of fluid. Darlene Kelley, Darlene Kelley

## 2016-05-28 NOTE — Anesthesia Preprocedure Evaluation (Signed)
Anesthesia Evaluation  Patient identified by MRN, date of birth, ID band Patient awake    Reviewed: Allergy & Precautions, NPO status , Patient's Chart, lab work & pertinent test results  Airway Mallampati: II       Dental no notable dental hx.    Pulmonary Current Smoker,    Pulmonary exam normal        Cardiovascular negative cardio ROS Normal cardiovascular exam     Neuro/Psych negative neurological ROS  negative psych ROS   GI/Hepatic Neg liver ROS, GERD  Medicated and Controlled,  Endo/Other  negative endocrine ROS  Renal/GU negative Renal ROS  negative genitourinary   Musculoskeletal negative musculoskeletal ROS (+)   Abdominal Normal abdominal exam  (+)   Peds negative pediatric ROS (+)  Hematology negative hematology ROS (+)   Anesthesia Other Findings   Reproductive/Obstetrics                             Anesthesia Physical Anesthesia Plan  ASA: II  Anesthesia Plan: Epidural   Post-op Pain Management:    Induction:   Airway Management Planned: Natural Airway  Additional Equipment:   Intra-op Plan:   Post-operative Plan:   Informed Consent: I have reviewed the patients History and Physical, chart, labs and discussed the procedure including the risks, benefits and alternatives for the proposed anesthesia with the patient or authorized representative who has indicated his/her understanding and acceptance.   Dental advisory given  Plan Discussed with: CRNA and Surgeon  Anesthesia Plan Comments:         Anesthesia Quick Evaluation

## 2016-05-28 NOTE — H&P (Signed)
Darlene Kelley is a 20 y.o. female [id number] at 39+6wks presenting for LOF that started last night and worsening today. No VB, good fetal movement. Baby girl. Uncertain contraceptive plans.  Preg c/b: Rh neg Hx of GC/CT and trich- treated,neg in pregnancy Hx of bipolar, on latuda and risperdal in past, no meds now  OB History    Gravida Para Term Preterm AB Living   5 2 2  0 2 2   SAB TAB Ectopic Multiple Live Births   0 2 0 0 2     Past Medical History:  Diagnosis Date  . Complication of anesthesia   . GERD (gastroesophageal reflux disease)    Past Surgical History:  Procedure Laterality Date  . NO PAST SURGERIES     Family History: family history is not on file. Social History:  reports that she has been smoking Cigarettes.  She has a 1.00 pack-year smoking history. She has never used smokeless tobacco. She reports that she does not drink alcohol or use drugs.     Maternal Diabetes: No Genetic Screening: Declined Maternal Ultrasounds/Referrals: Declined Fetal Ultrasounds or other Referrals:  None Maternal Substance Abuse:  No Significant Maternal Medications:  Meds include: Other:  hx of bipolar meds Significant Maternal Lab Results:  None - hx of GC/CT and trich Other Comments:  None  ROS History Dilation: 4.5 Effacement (%): 70 Station: Ballotable, -2 Exam by:: LSE Height 5\' 4"  (1.626 m), weight 138 lb (62.6 kg), last menstrual period 08/23/2015. Exam Physical Exam  Prenatal labs: ABO, Rh:   o neg Antibody:  neg Rubella:  immune RPR:    Non reactive HBsAg:   neg HIV:   neg GBS:   neg  Assessment/Plan: 1. Fetal Well being  - Fetal Tracing: Cat I - Group B Streptococcus ppx indicated: no - Presentation: vtx confirmed by sutures   2. Routine OB: - Prenatal labs reviewed, as above - Rh neg  3. Monitoring of Labor:  -  Contractions q5 min, external toco in place -  Pelvis proven to 7# -  Plan for continuous fetal monitoring  -  Maternal pain control with  iv or regional as desired - Anticipate vaginal delivery  4. Post Partum Planning: - Infant feeding: undecided- thinking bottle - Contraception: undecided    Christeen DouglasBEASLEY, Darlene Kelley 05/28/2016, 4:17 PM

## 2016-05-28 NOTE — Anesthesia Procedure Notes (Signed)
Epidural Patient location during procedure: OB Start time: 05/28/2016 9:15 PM End time: 05/28/2016 9:28 PM  Staffing Anesthesiologist: Yves DillARROLL, [place] Performed: anesthesiologist   Preanesthetic Checklist Completed: patient identified, site marked, surgical consent, pre-op evaluation, timeout performed, IV checked, risks and benefits discussed and monitors and equipment checked  Epidural Patient position: sitting Prep: Betadine and site prepped and draped Patient monitoring: heart rate, cardiac monitor, continuous pulse ox and blood pressure Approach: midline Location: L3-L4 Injection technique: LOR air  Needle:  Needle type: Tuohy  Needle gauge: 18 G Needle length: 9 cm Catheter type: closed end Catheter size: 20 Guage Test dose: negative and 1.5% lidocaine with Epi 1:200 K  Additional Notes Time out called.  Patient placed in sitting position.  Back prepped and draped in sterile fashion.  A skin wheal was made in the L3-L4 interspace with 1% Lidocaine plain. An 18G Tuohy needle was advanced to the epidural space by a loss of resistance technique.  The epidural catheter threaded easily 3 cm into the epidural space and the Test dose was negative.  The catheter was affixed to the back in sterile fashion.  No blood or paresthesias.  The patient tolerated the procedure well.

## 2016-05-29 LAB — CBC
HEMATOCRIT: 30.3 % — AB (ref 35.0–47.0)
HEMOGLOBIN: 10.4 g/dL — AB (ref 12.0–16.0)
MCH: 28.6 pg (ref 26.0–34.0)
MCHC: 34.5 g/dL (ref 32.0–36.0)
MCV: 82.8 fL (ref 80.0–100.0)
Platelets: 155 10*3/uL (ref 150–440)
RBC: 3.66 MIL/uL — AB (ref 3.80–5.20)
RDW: 13.5 % (ref 11.5–14.5)
WBC: 15.3 10*3/uL — AB (ref 3.6–11.0)

## 2016-05-29 LAB — FETAL SCREEN: FETAL SCREEN: NEGATIVE

## 2016-05-29 MED ORDER — COCONUT OIL OIL: 1.0000 "application " | TOPICAL_OIL | Status: DC | PRN

## 2016-05-29 MED ORDER — ONDANSETRON HCL 4 MG PO TABS: 4.0000 mg | ORAL_TABLET | ORAL | Status: DC | PRN

## 2016-05-29 MED ORDER — PRENATAL MULTIVITAMIN CH
1.0000 | ORAL_TABLET | Freq: Every day | ORAL | Status: DC
  Administered 2016-05-29 – 2016-05-30 (×2): 1 via ORAL
  Filled 2016-05-29 (×3): qty 1

## 2016-05-29 MED ORDER — FLEET ENEMA 7-19 GM/118ML RE ENEM
1.0000 | ENEMA | Freq: Every day | RECTAL | Status: DC | PRN
Start: 2016-05-29 — End: 2016-05-30

## 2016-05-29 MED ORDER — BISACODYL 10 MG RE SUPP: 10.0000 mg | Freq: Every day | RECTAL | Status: DC | PRN

## 2016-05-29 MED ORDER — BENZOCAINE-MENTHOL 20-0.5 % EX AERO: 1.0000 "application " | INHALATION_SPRAY | CUTANEOUS | Status: DC | PRN

## 2016-05-29 MED ORDER — ZOLPIDEM TARTRATE 5 MG PO TABS: 5.0000 mg | ORAL_TABLET | Freq: Every evening | ORAL | Status: DC | PRN

## 2016-05-29 MED ORDER — SENNOSIDES-DOCUSATE SODIUM 8.6-50 MG PO TABS: 2.0000 | ORAL_TABLET | ORAL | Status: DC

## 2016-05-29 MED ORDER — RHO D IMMUNE GLOBULIN 1500 UNIT/2ML IJ SOSY
300.0000 ug | PREFILLED_SYRINGE | Freq: Once | INTRAMUSCULAR | Status: AC
  Administered 2016-05-29: 300 ug via INTRAVENOUS
  Filled 2016-05-29: qty 2

## 2016-05-29 MED ORDER — IBUPROFEN 600 MG PO TABS
600.0000 mg | ORAL_TABLET | Freq: Four times a day (QID) | ORAL | Status: DC
  Administered 2016-05-29 – 2016-05-30 (×6): 600 mg via ORAL
  Filled 2016-05-29 (×6): qty 1

## 2016-05-29 MED ORDER — WITCH HAZEL-GLYCERIN EX PADS: 1.0000 "application " | MEDICATED_PAD | CUTANEOUS | Status: DC | PRN

## 2016-05-29 MED ORDER — DIBUCAINE 1 % RE OINT: 1.0000 "application " | TOPICAL_OINTMENT | RECTAL | Status: DC | PRN

## 2016-05-29 MED ORDER — SODIUM CHLORIDE 0.9% FLUSH: 3.0000 mL | INTRAVENOUS | Status: DC | PRN

## 2016-05-29 MED ORDER — DIPHENHYDRAMINE HCL 25 MG PO CAPS: 25.0000 mg | ORAL_CAPSULE | Freq: Four times a day (QID) | ORAL | Status: DC | PRN

## 2016-05-29 MED ORDER — MEASLES, MUMPS & RUBELLA VAC ~~LOC~~ INJ: 0.5000 mL | INJECTION | Freq: Once | SUBCUTANEOUS | Status: DC

## 2016-05-29 MED ORDER — SODIUM CHLORIDE 0.9 % IV SOLN: 250.0000 mL | INTRAVENOUS | Status: DC | PRN

## 2016-05-29 MED ORDER — ACETAMINOPHEN 325 MG PO TABS
650.0000 mg | ORAL_TABLET | ORAL | Status: DC | PRN
Start: 2016-05-29 — End: 2016-05-30

## 2016-05-29 MED ORDER — SIMETHICONE 80 MG PO CHEW: 80.0000 mg | CHEWABLE_TABLET | ORAL | Status: DC | PRN

## 2016-05-29 MED ORDER — TETANUS-DIPHTH-ACELL PERTUSSIS 5-2.5-18.5 LF-MCG/0.5 IM SUSP: 0.5000 mL | Freq: Once | INTRAMUSCULAR | Status: DC

## 2016-05-29 MED ORDER — SODIUM CHLORIDE 0.9% FLUSH
3.0000 mL | Freq: Two times a day (BID) | INTRAVENOUS | Status: DC
  Administered 2016-05-29: 3 mL via INTRAVENOUS

## 2016-05-29 MED ORDER — ONDANSETRON HCL 4 MG/2ML IJ SOLN: 4.0000 mg | INTRAMUSCULAR | Status: DC | PRN

## 2016-05-29 NOTE — Discharge Summary (Signed)
Obstetrical Discharge Summary  Patient Name: Darlene Kelley DOB: 1996/04/18 MRN: [id number]  Date of Admission: 05/28/2016 Date of Discharge: 05/30/16 Primary OB:  [facility]  Gestational Age at Delivery: 2850w0d   Antepartum complications:  - Hx of bipolar, not on meds in pregnancy - Hx of STDs- all negative on admission Admitting Diagnosis: PROM Secondary Diagnosis: none Patient Active Problem List   Diagnosis Date Noted  . Encounter for supervision of other normal pregnancy 05/28/2016  . PROM (premature rupture of membranes) 05/28/2016    Augmentation: Pitocin Complications: None Intrapartum complications/course: 60ZH [id number] G5P2022 at 40+0 wks presenting with rupture of membranes sometime last night for clear fluid. She progressed with minimal augmentation to fully dilated and pushed over an intact perineum with a good epidural. She pushed x2 with fetal head emerging followed immediately by the fetal body. No nuchal cord. The placenta delivered through a clamped cervix intact with trailing membranes. Manual evac no tissue. Minimal bleeding, no tears. Delayed cord clamping, baby to maternal abdomen. Weight pending. Bottle feeding. Date of Delivery: 05/29/16 Delivered By: Christeen DouglasBethany Beasley  Delivery Type: spontaneous vaginal delivery Anesthesia: epidural Placenta: sponatneous Laceration: none Episiotomy: none Newborn Data: Live born female  Birth Weight: 6 lb 0.3 oz (2730 g) APGAR: 9, 9    Discharge Physical Exam: 05/30/16 BP 119/68 (BP Location: Right Arm)   Pulse 91   Temp 98.7 F (37.1 C) (Oral)   Resp 18   Ht 5\' 4"  (1.626 m)   Wt 138 lb (62.6 kg)   LMP 08/23/2015   SpO2 100%   Breastfeeding? Unknown   BMI 23.69 kg/m   General: NAD CV: RRR Pulm: CTABL, nl effort ABD: s/nd/nt, fundus firm and below the umbilicus Lochia: moderate Incision: c/d/i DVT Evaluation: LE non-ttp, no evidence of DVT on exam.  Hemoglobin  Date Value Ref Range Status  05/29/2016 10.4 (L) 12.0  - 16.0 g/dL Final   HGB  Date Value Ref Range Status  02/26/2014 10.4 (L) 12.0 - 16.0 g/dL Final   HCT  Date Value Ref Range Status  05/29/2016 30.3 (L) 35.0 - 47.0 % Final  02/24/2014 36.1 35.0 - 47.0 % Final    Post partum course: uncomplicated Postpartum Procedures: none Disposition: stable, discharge to home. Baby Feeding: formula Baby Disposition: home with mom  Rh Immune globulin given: Rhogam given 05/29/16 Rubella vaccine given: N/A  Contraception: Undecided. She had AUB with all the other forms she has tried.  Possibly IUD or Nexplanon  Prenatal Labs:  ABO, Rh:   o neg Antibody:  neg Rubella:  immune RPR:    Non reactive HBsAg:   neg HIV:   neg GBS:   neg   Plan:  Darlene MowKesha C Kotowski was discharged to home in good condition. [facility]   Discharge Medications:   Medication List    STOP taking these medications   ranitidine 150 MG capsule Commonly known as:  ZANTAC     TAKE these medications   ibuprofen 600 MG tablet Commonly known as:  ADVIL,MOTRIN Take 1 tablet (600 mg total) by mouth every 6 (six) hours.   multivitamin-prenatal 27-0.8 MG Tabs tablet Take 1 tablet by mouth daily.         Signed: Carlean JewsMeredith Nazia Rhines, CNM

## 2016-05-30 LAB — RPR: RPR: NONREACTIVE

## 2016-05-30 MED ORDER — IBUPROFEN 600 MG PO TABS: 600.0000 mg | ORAL_TABLET | Freq: Four times a day (QID) | ORAL | 0 refills | Status: DC

## 2016-05-30 NOTE — Anesthesia Postprocedure Evaluation (Signed)
Anesthesia Post Note  Patient: Darlene Kelley  Procedure(s) Performed: * No procedures listed *  Patient location during evaluation: Mother Baby Anesthesia Type: Epidural Level of consciousness: awake and alert and oriented Pain management: satisfactory to patient Vital Signs Assessment: post-procedure vital signs reviewed and stable Respiratory status: respiratory function stable Cardiovascular status: blood pressure returned to baseline Postop Assessment: no headache, no backache, adequate PO intake, no signs of nausea or vomiting and patient able to bend at knees Anesthetic complications: no    Last Vitals:  Vitals:   05/29/16 1939 05/29/16 2322  BP: 131/70 (!) 116/53  Pulse: 93 93  Resp: 18 18  Temp: 36.9 C 36.6 C    Last Pain:  Vitals:   05/29/16 2322  TempSrc: Oral  PainSc:                  Clydene PughBeane, Lanique Gonzalo D

## 2016-05-30 NOTE — Progress Notes (Signed)
Patient discharged home with infant and significant other. Discharge instructions, prescriptions and follow up appointment given to and reviewed with patient and significant other. Patient verbalized understanding. Escorted out via wheelchair by axiliary.  

## 2016-05-31 LAB — RHOGAM INJECTION: UNIT DIVISION: 0

## 2016-06-20 IMAGING — US US OB COMP LESS 14 WK
1 series · 14 of 28 positions shown · non-contrast
Comparison: Ultrasound 06/23/2015 .

CLINICAL DATA: Pregnancy.  Irregular bleeding.

EXAM:
OBSTETRIC <14 WK US AND TRANSVAGINAL OB US
TECHNIQUE: Both transabdominal and transvaginal ultrasound examinations were
performed for complete evaluation of the gestation as well as the
maternal uterus, adnexal regions, and pelvic cul-de-sac.
Transvaginal technique was performed to assess early pregnancy.

[Series 1: us ob comp less 14 wk · 0.16mm/px · 14 of 187 slices shown]
[im 7/187]
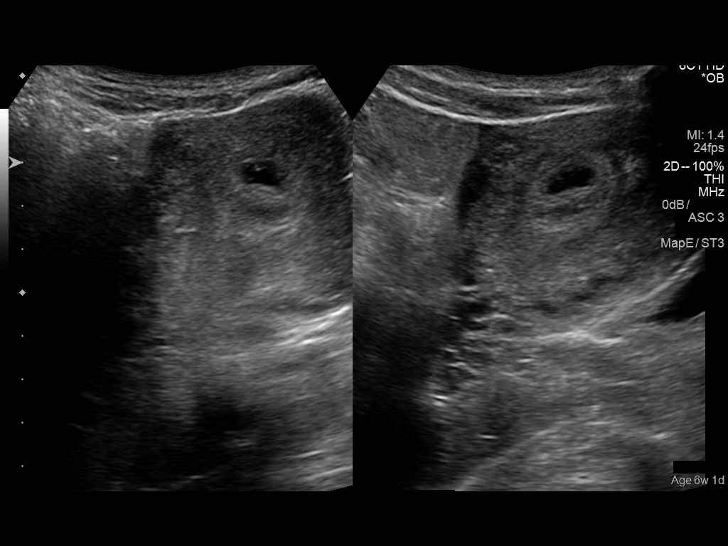
[im 21/187]
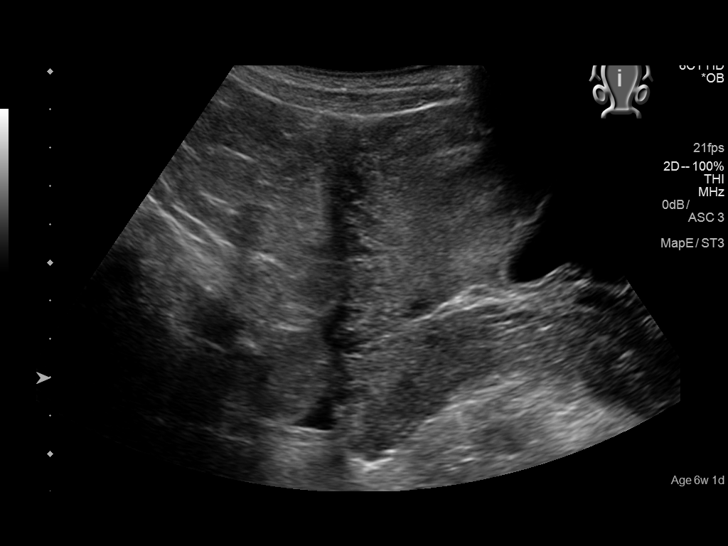
[im 35/187]
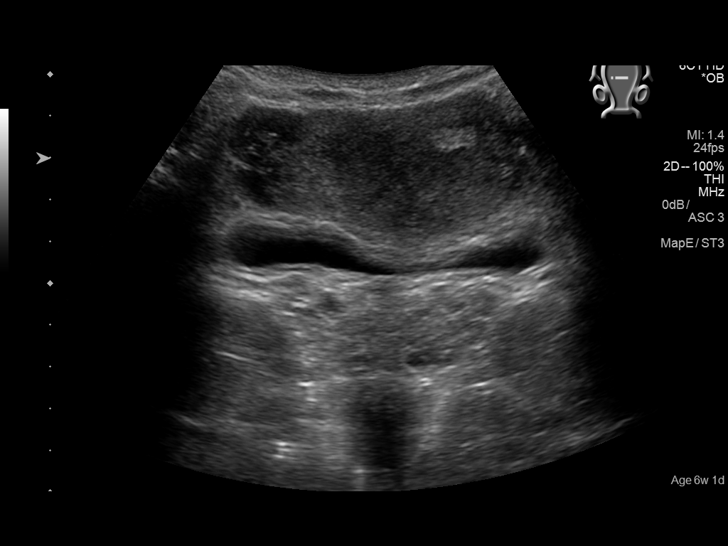
[im 49/187]
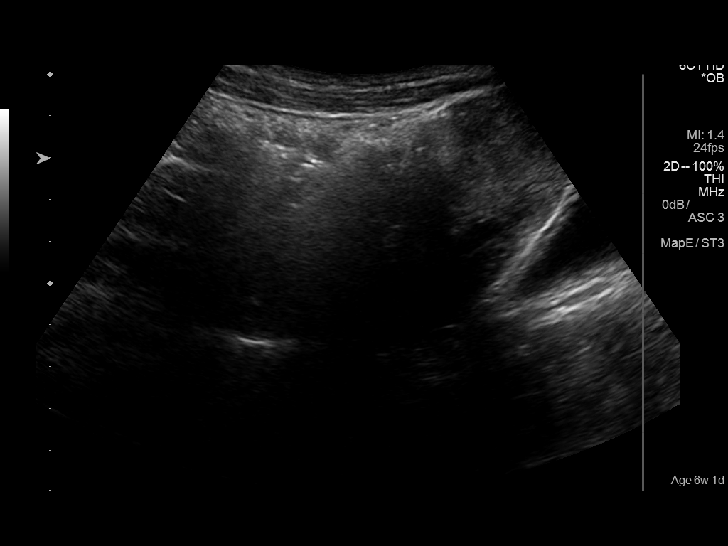
[im 63/187]
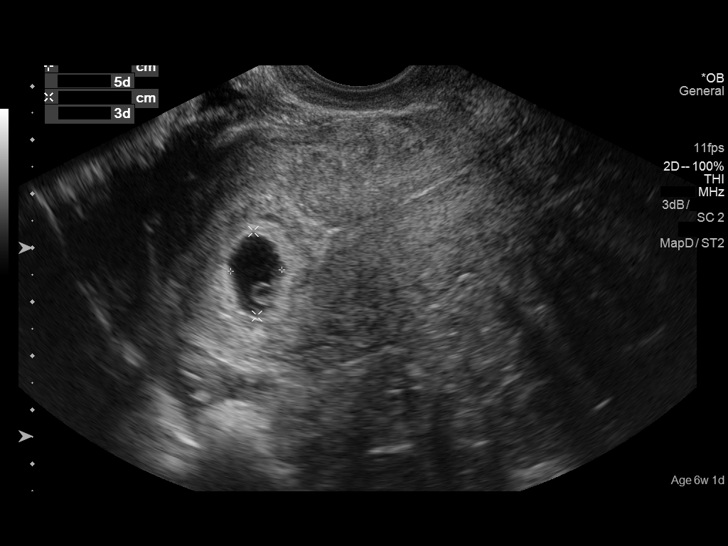
[im 76/187]
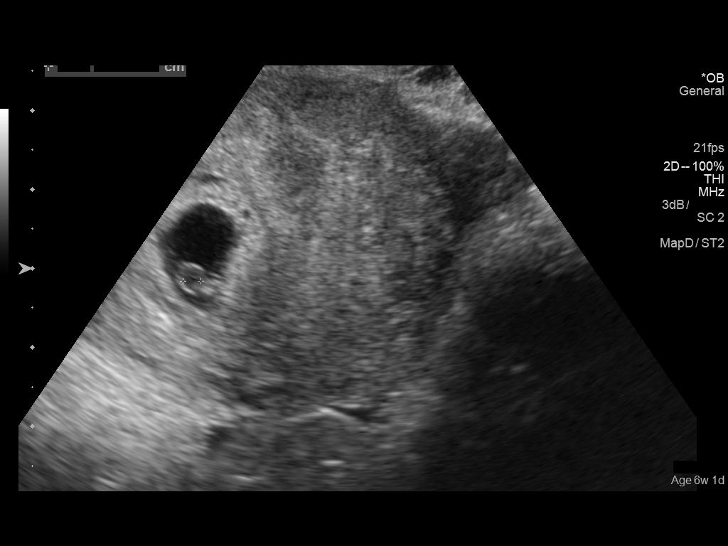
[im 90/187]
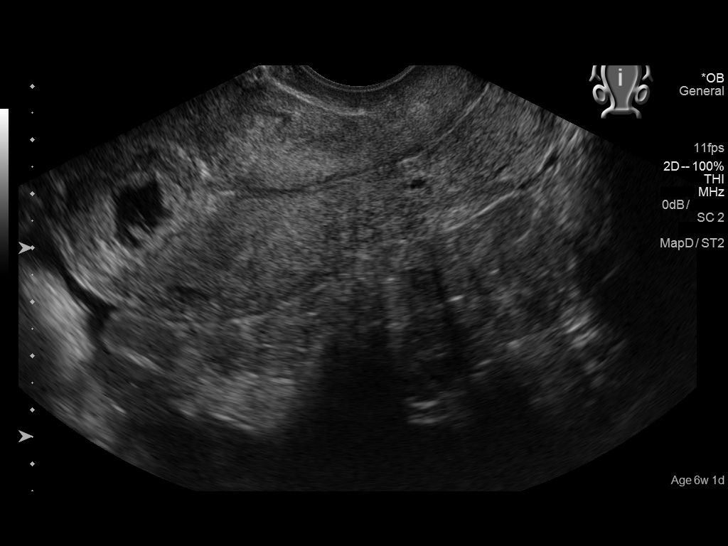
[im 104/187]
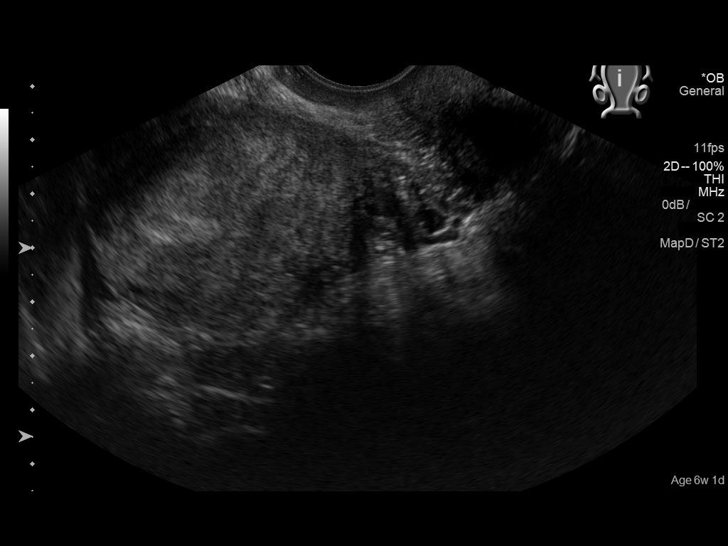
[im 118/187]
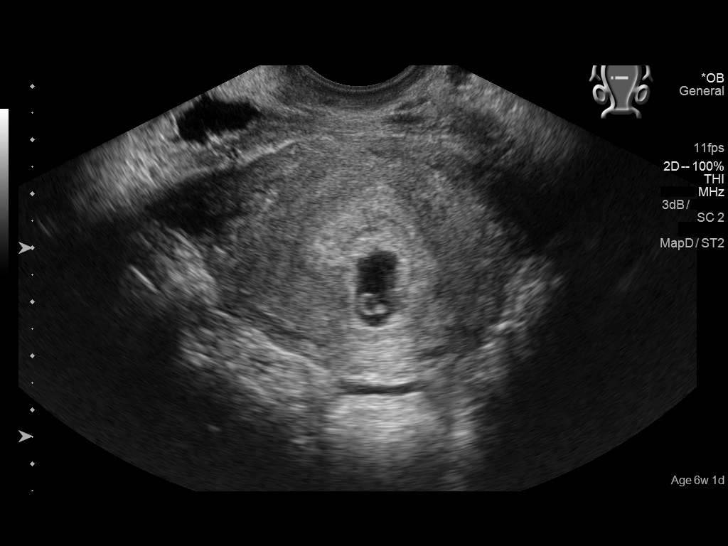
[im 131/187]
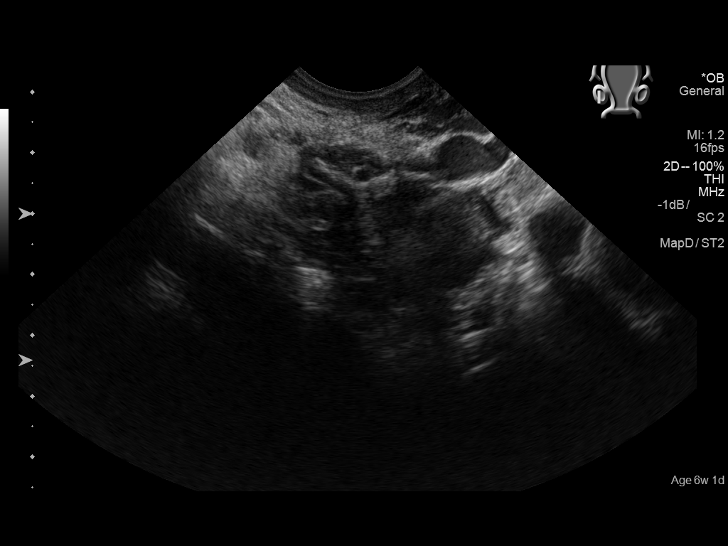
[im 145/187]
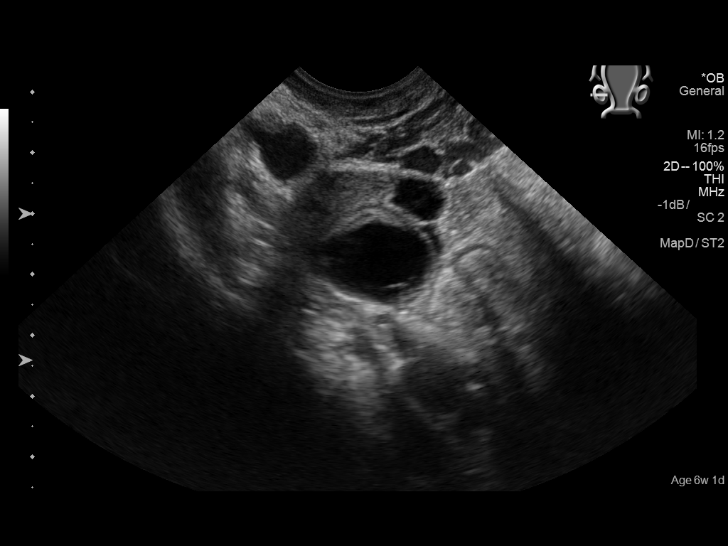
[im 159/187]
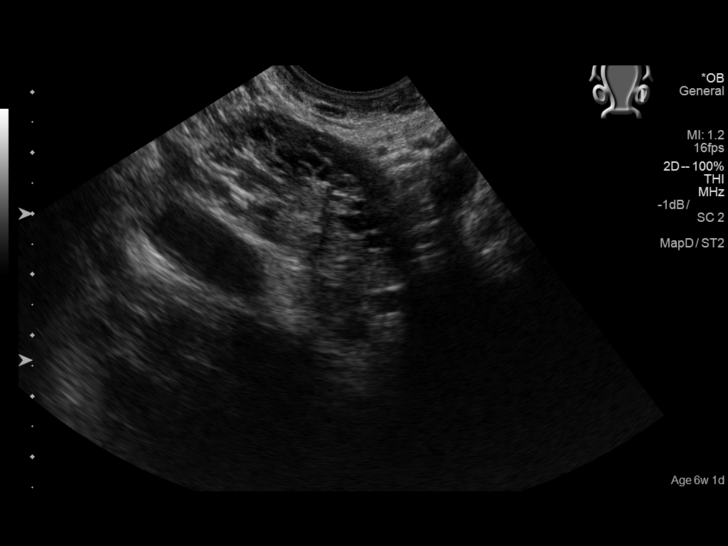
[im 173/187]
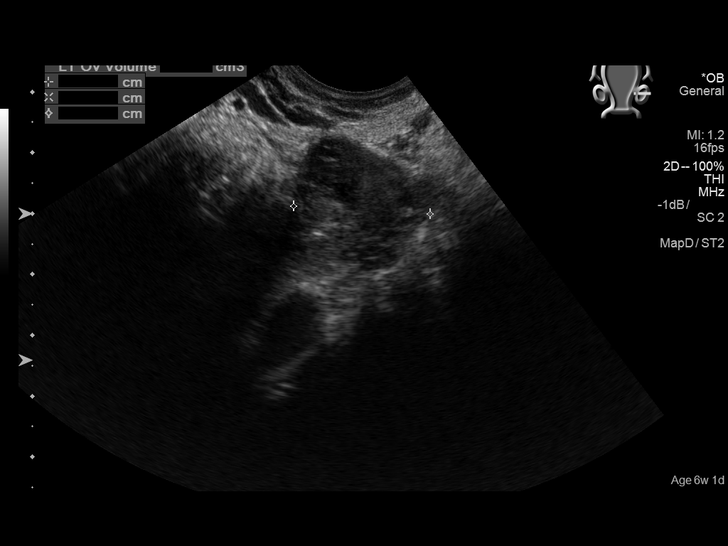
[im 187/187]
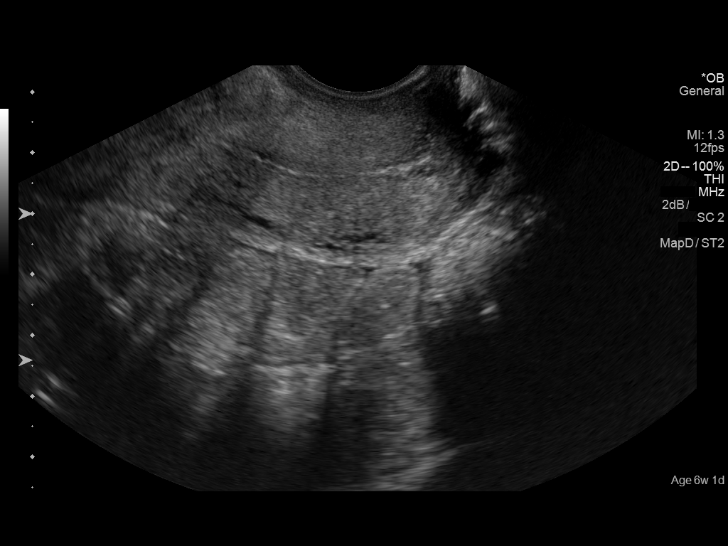

[14 of 28 positions shown; findings below may reference images not displayed]

FINDINGS: Intrauterine gestational sac: Single

Yolk sac:  Present

Embryo:  Present

Cardiac Activity: Present

Heart Rate: 104  bpm

CRL:  4  mm   6 w   0 d                  US EDC: 05/30/2016

Subchorionic hemorrhage:  None visualized.

Maternal uterus/adnexae: Trace free pelvic fluid. Complex right
adnexal cyst previously identified has patch that decreased in size
to 2.8 cm down from 4.3 cm noted on ultrasound 06/23/2015.
IMPRESSION: 1. Single viable intrauterine pregnancy at weeks 0 days. Fetal heart
rate 104 beats per minute.

2. Trace free pelvic fluid. Previously identified complex right
adnexal cyst has decreased in size .

## 2016-09-18 IMAGING — US US OB COMP +14 WK
1 series · 13 of 28 positions shown · non-contrast
Comparison: none

CLINICAL DATA: Current assigned gestational age of 19 weeks 0 days.
Evaluate fetal anatomy and growth.

EXAM:
OBSTETRICAL ULTRASOUND >14 WKS

[Series 1: us ob comp +14 wk · 0.21mm/px · 13 of 110 slices shown]
[im 5/110]
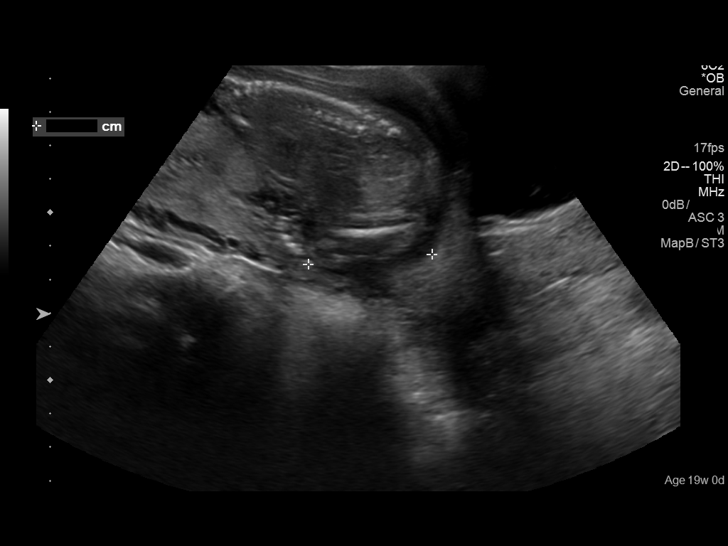
[im 13/110]
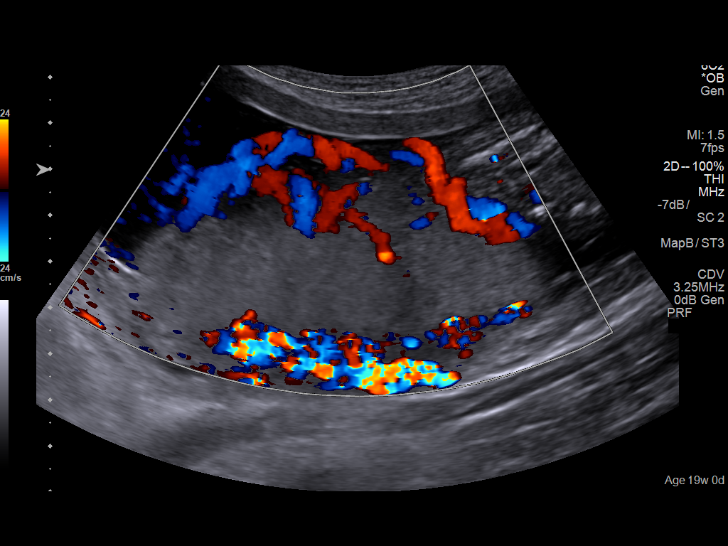
[im 21/110]
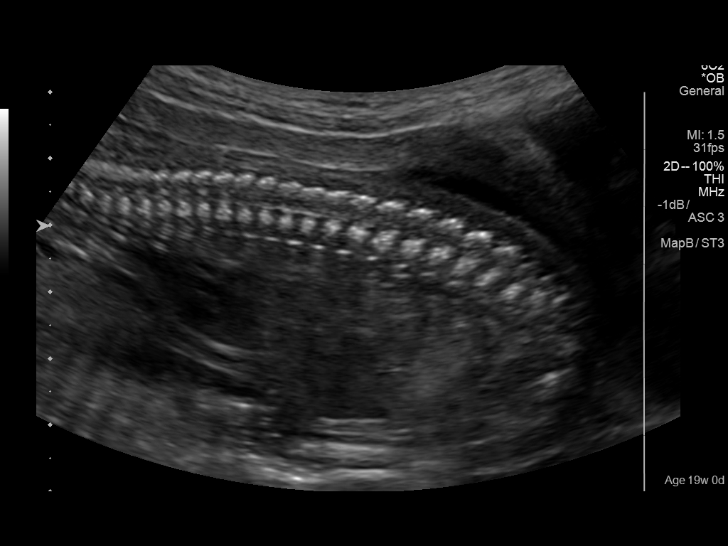
[im 29/110]
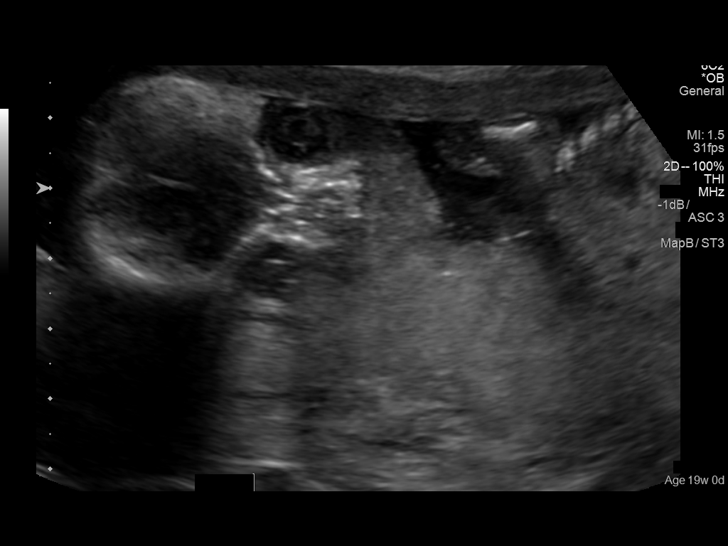
[im 37/110]
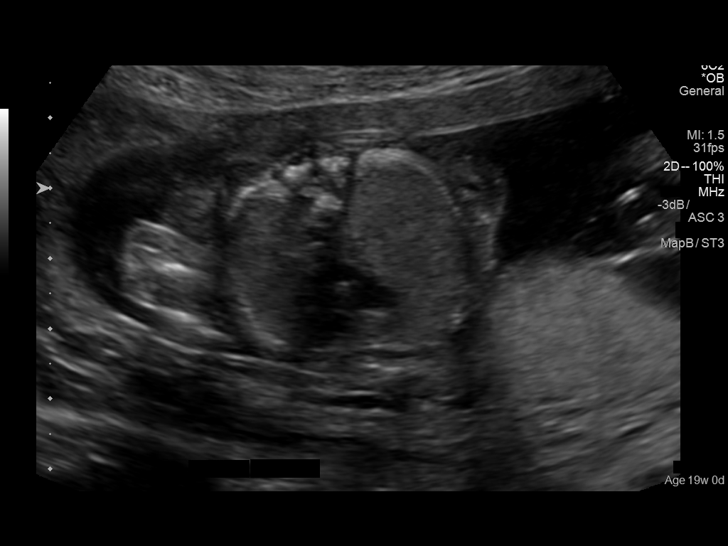
[im 45/110]
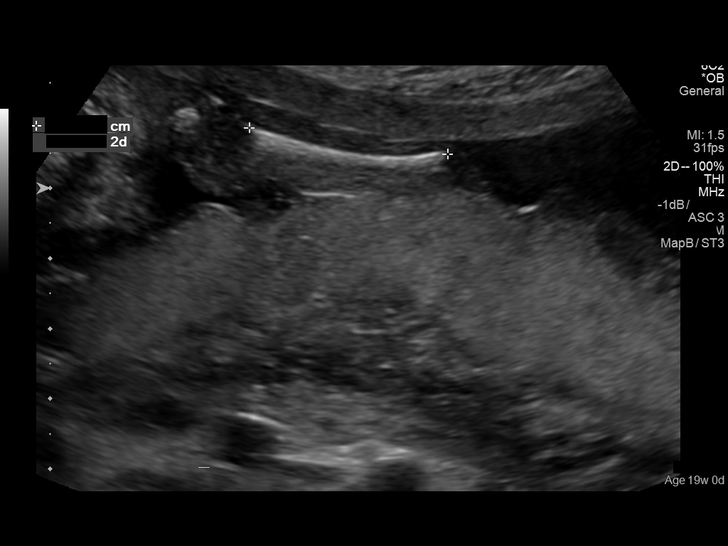
[im 57/110]
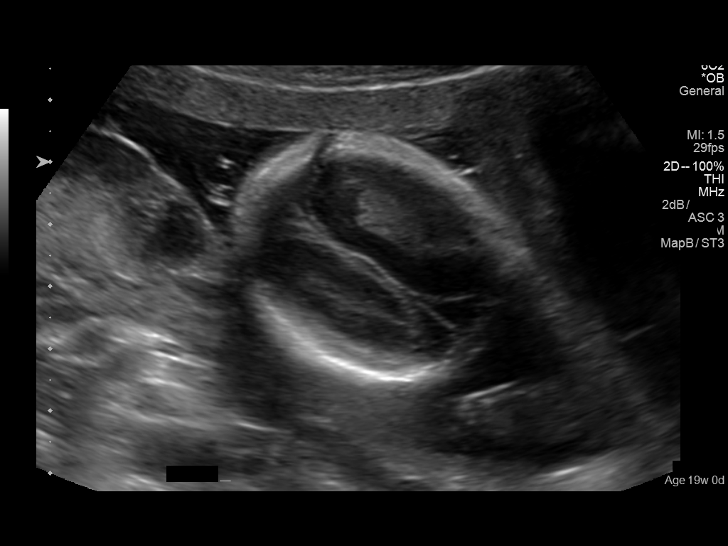
[im 65/110]
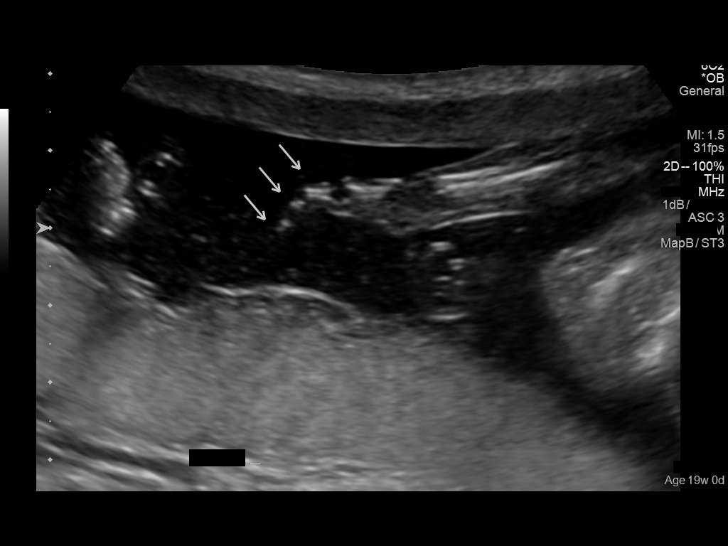
[im 73/110]
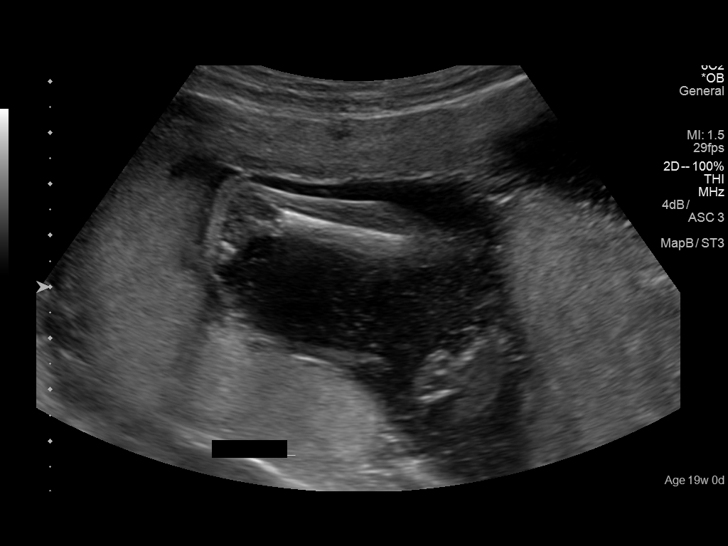
[im 81/110]
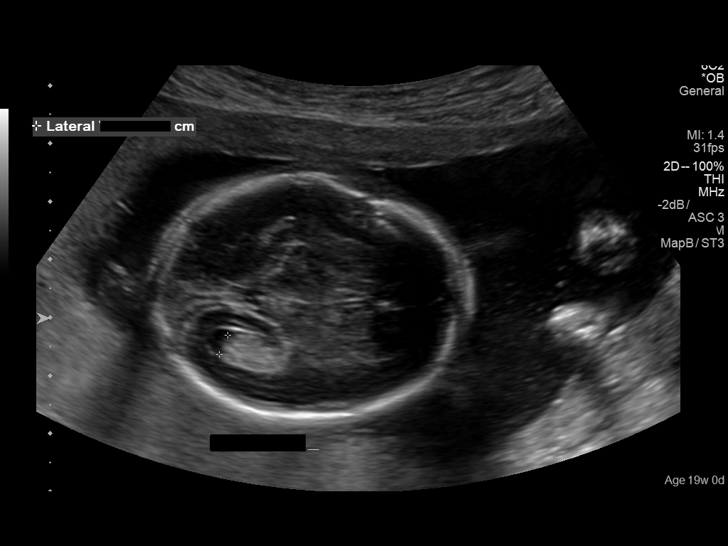
[im 89/110]
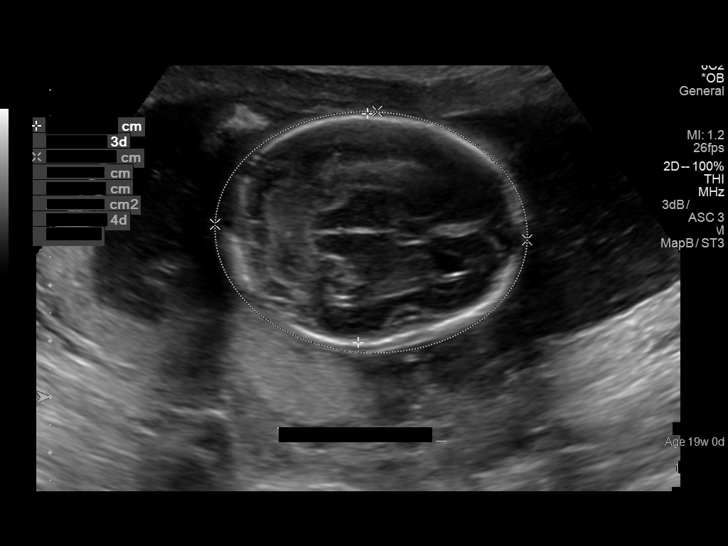
[im 97/110]
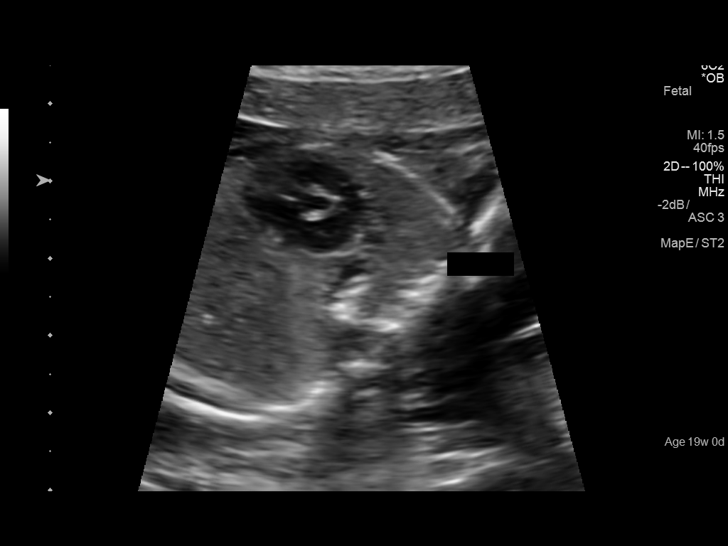
[im 105/110]
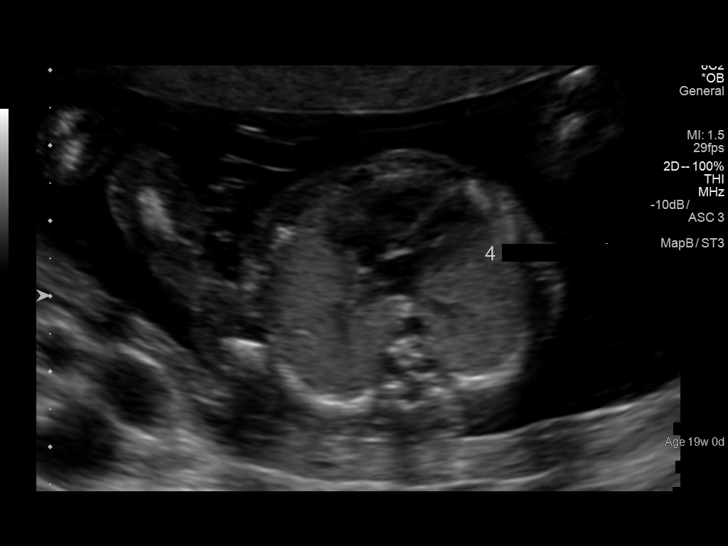

[13 of 28 positions shown; findings below may reference images not displayed]

FINDINGS: Number of Fetuses: 1

Heart Rate:  144 bpm

Movement: Yes

Presentation: Variable

Previa: No

Placental Location: Posterior

Amniotic Fluid (Subjective): Normal

Amniotic Fluid (Objective):

Vertical pocket 2.5cm

FETAL BIOMETRY

BPD:  4.14cm 18w 4d

HC:    15.65cm  18w   4d

AC:   13.49cm  19w   0d

FL:   2.82cm  19w   1d

Current Mean GA: 18w 6d              US EDC: 05/30/16

Assigned gestational age: 19 weeks 0 days. Assigned EDC: 05/29/2016

FETAL ANATOMY

Lateral Ventricles:  Appear normal

Thalami/CSP:  Appear normal

Posterior Fossa:  Appears normal

Nuchal Region:  Appears normal    NFT= 2mm

Upper Lip:  Appears normal

Spine:  Appears normal

4 Chamber Heart on Left:  Appears normal

LVOT:  Appears normal

RVOT:  Appears normal

Stomach on Left:  Appears normal

3 Vessel Cord:  Appears normal

Cord Insertion site:  Appears normal

Kidneys:  Appear normal

Bladder:  Appears normal

Extremities:  Appear normal

Technically difficult due to:  Fetal position

Maternal Findings:

Cervix:  3.1 cm transabdominally
IMPRESSION: Single IUP with assigned gestational age currently 19 weeks 0 days.
Appropriate fetal growth.

No fetal anomalies seen involving visualized anatomy noted above.

## 2016-12-29 ENCOUNTER — Encounter: Payer: Self-pay | Admitting: Medical Oncology

## 2016-12-29 ENCOUNTER — Emergency Department: Payer: Medicaid Other

## 2016-12-29 ENCOUNTER — Emergency Department
Admission: EM | Admit: 2016-12-29 | Discharge: 2016-12-29 | Disposition: A | Discharge status: Home / Self Care | Payer: Medicaid Other | Attending: Student in an Organized Health Care Education/Training Program | Admitting: Student in an Organized Health Care Education/Training Program

## 2016-12-29 DIAGNOSIS — Z3A00 Weeks of gestation of pregnancy not specified: Secondary | ICD-10-CM | POA: Diagnosis not present

## 2016-12-29 DIAGNOSIS — O034 Incomplete spontaneous abortion without complication: Secondary | ICD-10-CM

## 2016-12-29 DIAGNOSIS — N939 Abnormal uterine and vaginal bleeding, unspecified: Secondary | ICD-10-CM

## 2016-12-29 DIAGNOSIS — O99330 Smoking (tobacco) complicating pregnancy, unspecified trimester: Secondary | ICD-10-CM | POA: Insufficient documentation

## 2016-12-29 DIAGNOSIS — F17210 Nicotine dependence, cigarettes, uncomplicated: Secondary | ICD-10-CM | POA: Diagnosis not present

## 2016-12-29 LAB — BASIC METABOLIC PANEL
ANION GAP: 3 — AB (ref 5–15)
BUN: 14 mg/dL (ref 6–20)
CALCIUM: 9.2 mg/dL (ref 8.9–10.3)
CO2: 26 mmol/L (ref 22–32)
CREATININE: 0.65 mg/dL (ref 0.44–1.00)
Chloride: 108 mmol/L (ref 101–111)
GLUCOSE: 74 mg/dL (ref 65–99)
Potassium: 3.5 mmol/L (ref 3.5–5.1)
Sodium: 137 mmol/L (ref 135–145)

## 2016-12-29 LAB — CBC WITH DIFFERENTIAL/PLATELET
BASOS ABS: 0 10*3/uL (ref 0–0.1)
BASOS PCT: 0 %
EOS PCT: 5 %
Eosinophils Absolute: 0.3 10*3/uL (ref 0–0.7)
HCT: 34.5 % — ABNORMAL LOW (ref 35.0–47.0)
Hemoglobin: 12 g/dL (ref 12.0–16.0)
Lymphocytes Relative: 33 %
Lymphs Abs: 1.9 10*3/uL (ref 1.0–3.6)
MCH: 30.1 pg (ref 26.0–34.0)
MCHC: 34.7 g/dL (ref 32.0–36.0)
MCV: 86.7 fL (ref 80.0–100.0)
MONO ABS: 0.5 10*3/uL (ref 0.2–0.9)
Monocytes Relative: 9 %
Neutro Abs: 3.2 10*3/uL (ref 1.4–6.5)
Neutrophils Relative %: 53 %
PLATELETS: 204 10*3/uL (ref 150–440)
RBC: 3.98 MIL/uL (ref 3.80–5.20)
RDW: 13.4 % (ref 11.5–14.5)
WBC: 5.9 10*3/uL (ref 3.6–11.0)

## 2016-12-29 LAB — POCT PREGNANCY, URINE: Preg Test, Ur: NEGATIVE

## 2016-12-29 LAB — HCG, QUANTITATIVE, PREGNANCY: HCG, BETA CHAIN, QUANT, S: 15 m[IU]/mL — AB (ref <5)

## 2016-12-29 MED ORDER — IBUPROFEN 600 MG PO TABS
600.0000 mg | ORAL_TABLET | Freq: Once | ORAL | Status: AC
  Administered 2016-12-29: 600 mg via ORAL
  Filled 2016-12-29: qty 1

## 2016-12-29 MED ORDER — LORAZEPAM 2 MG PO TABS
5.0000 mg | ORAL_TABLET | Freq: Once | ORAL | Status: AC
  Administered 2016-12-29: 5 mg via ORAL
  Filled 2016-12-29: qty 2

## 2016-12-29 MED ORDER — DOXYCYCLINE HYCLATE 100 MG PO TABS
100.0000 mg | ORAL_TABLET | Freq: Once | ORAL | Status: AC
  Administered 2016-12-29: 100 mg via ORAL
  Filled 2016-12-29: qty 1

## 2016-12-29 MED ORDER — OXYCODONE-ACETAMINOPHEN 5-325 MG PO TABS
1.0000 | ORAL_TABLET | Freq: Once | ORAL | Status: AC
  Administered 2016-12-29: 1 via ORAL
  Filled 2016-12-29: qty 1

## 2016-12-29 MED ORDER — LORAZEPAM 2 MG PO TABS
ORAL_TABLET | ORAL | Status: AC
  Filled 2016-12-29: qty 1

## 2016-12-29 NOTE — Discharge Instructions (Signed)
Please go directly to Dr.Ward's clinic.  REturn to ED for any additional questions or concerns.

## 2016-12-29 NOTE — ED Triage Notes (Signed)
Pt reports she went to work this am and placed a tampon in around 0630 and by 0730 the tampon was saturated and pt was having excessive vaginal bleeding. Pt reports she had abortion on 6/8 and since has been having intermittent spotting but nothing heavy. Pt denies pain.

## 2016-12-29 NOTE — ED Notes (Addendum)
Pt states she had an abortion on June 8, was bleeding after that and stopped, pt states she started spotting again. C/o minimal cramping at this time. Pt is alert and oriented x4. Respirations equal and unlabored. Given urine cup for specimen.

## 2016-12-29 NOTE — ED Notes (Signed)
[facility].

## 2016-12-29 NOTE — ED Notes (Signed)
Returned from U/S

## 2016-12-29 NOTE — ED Notes (Signed)
Patient transported to Ultrasound 

## 2016-12-29 NOTE — ED Provider Notes (Signed)
[facility] Emergency Department Provider Note    None    (approximate)  I have reviewed the triage vital signs and the nursing notes.   HISTORY  Chief Complaint Vaginal Bleeding    HPI Darlene Kelley is a 21 y.o. female [id number]  presents roughly 1 month after elective first trimester abortion presents with recurrent vaginal bleeding. Patient states that she does not recall ever passing large clots or products of conception. She is to have a 2 week follow-up but did not show up. Came back today because she has been having intermittent spotting and inserted a tampon today at work and had large volume vaginal bleeding. This is only with one tampon. She denies any shortness of breath. No lightheadedness. No weakness.  No family h/o bleeding disorders   Past Medical History:  Diagnosis Date  . GERD (gastroesophageal reflux disease)    [facility]: no bleeding disorders Past Surgical History:  Procedure Laterality Date  . WISDOM TOOTH EXTRACTION     Patient Active Problem List   Diagnosis Date Noted  . Encounter for supervision of other normal pregnancy 05/28/2016  . PROM (premature rupture of membranes) 05/28/2016      Prior to Admission medications   Medication Sig Start Date End Date Taking? Authorizing Provider  ibuprofen (ADVIL,MOTRIN) 600 MG tablet Take 1 tablet (600 mg total) by mouth every 6 (six) hours. Patient not taking: Reported on 12/29/2016 05/30/16   Karena Addison, CNM    Allergies Patient has no known allergies.    Social History Social History  Substance Use Topics  . Smoking status: Current Every Day Smoker    Packs/day: 0.25    Years: 4.00    Types: Cigarettes  . Smokeless tobacco: Never Used  . Alcohol use No     Comment: Occasional    Review of Systems Patient denies headaches, rhinorrhea, blurry vision, numbness, shortness of breath, chest pain, edema, cough, abdominal pain, nausea, vomiting, diarrhea, dysuria, fevers,  rashes or hallucinations unless otherwise stated above in HPI. ____________________________________________   PHYSICAL EXAM:  VITAL SIGNS: Vitals:   12/29/16 1254 12/29/16 1255  BP: 121/73   Pulse: 74 74  Resp: 18   Temp:      Constitutional: Alert and oriented. Well appearing and in no acute distress. Eyes: Conjunctivae are normal.  Head: Atraumatic. Nose: No congestion/rhinnorhea. Mouth/Throat: Mucous membranes are moist.   Neck: No stridor. Painless ROM.  Cardiovascular: Normal rate, regular rhythm. Grossly normal heart sounds.  Good peripheral circulation. Respiratory: Normal respiratory effort.  No retractions. Lungs CTAB. Gastrointestinal: Soft and nontender. No distention. No abdominal bruits. No CVA tenderness.  no uterine ttp Musculoskeletal: No lower extremity tenderness nor edema.  No joint effusions. Neurologic:  Normal speech and language. No gross focal neurologic deficits are appreciated. No facial droop Skin:  Skin is warm, dry and intact. No rash noted. Psychiatric: Mood and affect are normal. Speech and behavior are normal.  ____________________________________________   LABS (all labs ordered are listed, but only abnormal results are displayed)  Results for orders placed or performed during the hospital encounter of 12/29/16 (from the past 24 hour(s))  hCG, quantitative, pregnancy     Status: Abnormal   Collection Time: 12/29/16  9:22 AM  Result Value Ref Range   hCG, Beta Chain, Quant, S 15 (H) <5 mIU/mL  CBC with Differential/Platelet     Status: Abnormal   Collection Time: 12/29/16  9:22 AM  Result Value Ref Range   WBC 5.9  3.6 - 11.0 K/uL   RBC 3.98 3.80 - 5.20 MIL/uL   Hemoglobin 12.0 12.0 - 16.0 g/dL   HCT 40.934.5 (L) 81.135.0 - 91.447.0 %   MCV 86.7 80.0 - 100.0 fL   MCH 30.1 26.0 - 34.0 pg   MCHC 34.7 32.0 - 36.0 g/dL   RDW 78.213.4 95.611.5 - 21.314.5 %   Platelets 204 150 - 440 K/uL   Neutrophils Relative % 53 %   Neutro Abs 3.2 1.4 - 6.5 K/uL   Lymphocytes  Relative 33 %   Lymphs Abs 1.9 1.0 - 3.6 K/uL   Monocytes Relative 9 %   Monocytes Absolute 0.5 0.2 - 0.9 K/uL   Eosinophils Relative 5 %   Eosinophils Absolute 0.3 0 - 0.7 K/uL   Basophils Relative 0 %   Basophils Absolute 0.0 0 - 0.1 K/uL  Basic metabolic panel     Status: Abnormal   Collection Time: 12/29/16  9:22 AM  Result Value Ref Range   Sodium 137 135 - 145 mmol/L   Potassium 3.5 3.5 - 5.1 mmol/L   Chloride 108 101 - 111 mmol/L   CO2 26 22 - 32 mmol/L   Glucose, Bld 74 65 - 99 mg/dL   BUN 14 6 - 20 mg/dL   Creatinine, Ser 0.860.65 0.44 - 1.00 mg/dL   Calcium 9.2 8.9 - 57.810.3 mg/dL   GFR calc non Af Amer >60 >60 mL/min   GFR calc Af Amer >60 >60 mL/min   Anion gap 3 (L) 5 - 15  Pregnancy, urine POC     Status: None   Collection Time: 12/29/16  9:34 AM  Result Value Ref Range   Preg Test, Ur NEGATIVE NEGATIVE   ____________________________________________ ____________________________________________  RADIOLOGY  I personally reviewed all radiographic images ordered to evaluate for the above acute complaints and reviewed radiology reports and findings.  These findings were personally discussed with the patient.  Please see medical record for radiology report.  ____________________________________________   PROCEDURES  Procedure(s) performed:  Procedures    Critical Care performed: no ____________________________________________   INITIAL IMPRESSION / ASSESSMENT AND PLAN / ED COURSE  Pertinent labs & imaging results that were available during my care of the patient were reviewed by me and considered in my medical decision making (see chart for details).  DDX: retained POC, threatened ab, dysmenorrhea, ectopic  Darlene Kelley is a 21 y.o. who presents to the ED with vaginal bleeding roughly 1 month after having a medical abortion. She's complaining with intermittent spotting and now larger volume bleeding that started today. Patient seemed amply stable. Bleeding is  moderate but not severe based on her history ultrasound will be ordered to evaluate for evidence of retained products of conception versus new pregnancy and possible ectopic.   Clinical Course as of Dec 29 1256  Fri Dec 29, 2016  1243 Discussed results of ultrasound with the patient. I have also spoken with Dr. Elesa MassedWard of OB/GYN regarding the patient's presentation. Treatment options including a trial of Cytotec with follow-up ultrasound versus D&E were discussed at bedside with the patient. The patient has elected to proceed with D&E. Dr. Elesa MassedWard is currently agreed to see patient in clinic today. I have provided the patient with the medications as requested by Dr. Elesa MassedWard. Patient is to return to the ER should she have any additional concerns or complaints.  [PR]    Clinical Course User Index [PR] Willy Eddyobinson, Rozalynn Buege, MD     ____________________________________________   FINAL CLINICAL IMPRESSION(S) /  ED DIAGNOSES  Final diagnoses:  Vaginal bleeding  Retained products of conception following abortion      NEW MEDICATIONS STARTED DURING THIS VISIT:  New Prescriptions   No medications on file     Note:  This document was prepared using Dragon voice recognition software and may include unintentional dictation errors.    Willy Eddy, MD 12/29/16 1259

## 2018-01-24 ENCOUNTER — Other Ambulatory Visit: Payer: Self-pay

## 2018-01-24 ENCOUNTER — Emergency Department: Payer: Medicaid Other

## 2018-01-24 ENCOUNTER — Encounter: Payer: Self-pay | Admitting: Emergency Medicine

## 2018-01-24 ENCOUNTER — Emergency Department
Admission: EM | Admit: 2018-01-24 | Discharge: 2018-01-24 | Disposition: A | Discharge status: Home / Self Care | Payer: Medicaid Other | Attending: Emergency Medicine | Admitting: Emergency Medicine

## 2018-01-24 DIAGNOSIS — Y999 Unspecified external cause status: Secondary | ICD-10-CM | POA: Diagnosis not present

## 2018-01-24 DIAGNOSIS — S6992XA Unspecified injury of left wrist, hand and finger(s), initial encounter: Secondary | ICD-10-CM | POA: Diagnosis present

## 2018-01-24 DIAGNOSIS — L923 Foreign body granuloma of the skin and subcutaneous tissue: Secondary | ICD-10-CM | POA: Diagnosis not present

## 2018-01-24 DIAGNOSIS — W458XXA Other foreign body or object entering through skin, initial encounter: Secondary | ICD-10-CM | POA: Insufficient documentation

## 2018-01-24 DIAGNOSIS — Z1812 Retained nonmagnetic metal fragments: Secondary | ICD-10-CM | POA: Insufficient documentation

## 2018-01-24 DIAGNOSIS — Y939 Activity, unspecified: Secondary | ICD-10-CM | POA: Insufficient documentation

## 2018-01-24 DIAGNOSIS — F17210 Nicotine dependence, cigarettes, uncomplicated: Secondary | ICD-10-CM | POA: Diagnosis not present

## 2018-01-24 DIAGNOSIS — Y929 Unspecified place or not applicable: Secondary | ICD-10-CM | POA: Diagnosis not present

## 2018-01-24 DIAGNOSIS — M795 Residual foreign body in soft tissue: Secondary | ICD-10-CM

## 2018-01-24 MED ORDER — IBUPROFEN 600 MG PO TABS: 600.0000 mg | ORAL_TABLET | Freq: Three times a day (TID) | ORAL | 0 refills | Status: DC | PRN

## 2018-01-24 MED ORDER — CEPHALEXIN 500 MG PO CAPS: 500.0000 mg | ORAL_CAPSULE | Freq: Three times a day (TID) | ORAL | 0 refills | Status: DC

## 2018-01-24 MED ORDER — LIDOCAINE HCL (PF) 1 % IJ SOLN
10.0000 mL | Freq: Once | INTRAMUSCULAR | Status: AC
  Administered 2018-01-24: 10 mL
  Filled 2018-01-24: qty 10

## 2018-01-24 NOTE — ED Triage Notes (Signed)
Patient reports fish hook in left thumb today. Hook broken off in unsuccessful attempt to remove it. No active bleeding. Patient A&O x4.

## 2018-01-24 NOTE — ED Notes (Signed)
See triage note  Presents with a fish hook to left thumb  States she cut off the end of hook  But unable to remove the other end

## 2018-01-24 NOTE — ED Provider Notes (Signed)
[facility] Emergency Department Provider Note   ____________________________________________   First MD Initiated Contact with Patient 01/24/18 1309     (approximate)  I have reviewed the triage vital signs and the nursing notes.   HISTORY  Chief Complaint Foreign Body in Skin   HPI Jen MowKesha C Addo is a 22 y.o. female presents to the ED with a fish hook in her left thumb.  Patient states that her father caught the remaining fishhook off and on attempt to remove it which was unsuccessful.  Patient rates her pain as 6/10.   Past Medical History:  Diagnosis Date  . GERD (gastroesophageal reflux disease)     Patient Active Problem List   Diagnosis Date Noted  . Encounter for supervision of other normal pregnancy 05/28/2016  . PROM (premature rupture of membranes) 05/28/2016    Past Surgical History:  Procedure Laterality Date  . WISDOM TOOTH EXTRACTION      Prior to Admission medications   Medication Sig Start Date End Date Taking? Authorizing Provider  cephALEXin (KEFLEX) 500 MG capsule Take 1 capsule (500 mg total) by mouth 3 (three) times daily. 01/24/18   Tommi RumpsSummers, Tasharra Nodine L, PA-C  ibuprofen (ADVIL,MOTRIN) 600 MG tablet Take 1 tablet (600 mg total) by mouth every 8 (eight) hours as needed for moderate pain. 01/24/18   Tommi RumpsSummers, Amenda Duclos L, PA-C    Allergies Patient has no known allergies.  No family history on file.  Social History Social History   Tobacco Use  . Smoking status: Current Every Day Smoker    Packs/day: 0.25    Years: 4.00    Pack years: 1.00    Types: Cigarettes  . Smokeless tobacco: Never Used  Substance Use Topics  . Alcohol use: No    Comment: Occasional  . Drug use: No    Review of Systems Constitutional: No fever/chills Cardiovascular: Denies chest pain. Respiratory: Denies shortness of breath. Musculoskeletal: Positive left thumb pain. Skin: Positive for foreign body. Neurological: Negative for  focal weakness  or numbness. ____________________________________________   PHYSICAL EXAM:  VITAL SIGNS: ED Triage Vitals  Enc Vitals Group     BP 01/24/18 1255 111/71     Pulse Rate 01/24/18 1255 90     Resp 01/24/18 1255 16     Temp 01/24/18 1255 98.2 F (36.8 C)     Temp Source 01/24/18 1255 Oral     SpO2 01/24/18 1255 100 %     Weight 01/24/18 1256 112 lb (50.8 kg)     Height 01/24/18 1256 5\' 6"  (1.676 m)     Head Circumference --      Peak Flow --      Pain Score 01/24/18 1255 6     Pain Loc --      Pain Edu? --      Excl. in GC? --    Constitutional: Alert and oriented. Well appearing and in no acute distress. Eyes: Conjunctivae are normal.  Head: Atraumatic. Neck: No stridor.   Respiratory: Normal respiratory effort.  No retractions.  Musculoskeletal: Left thumb volar aspect with metallic foreign body protruding.  Range of motion is restricted secondary to foreign body.  Motor or sensory function intact.  No active bleeding at the site. Neurologic:  Normal speech and language. No gross focal neurologic deficits are appreciated.  Skin:  Skin is warm, dry.  Foreign body is noted above. Psychiatric: Mood and affect are normal. Speech and behavior are normal.  ____________________________________________   LABS (all labs  ordered are listed, but only abnormal results are displayed)  Labs Reviewed - No data to display  RADIOLOGY  ED MD interpretation:  Left thumb x-ray is positive for metallic foreign body.  Official radiology report(s): Dg Finger Thumb Left  Result Date: 01/24/2018 CLINICAL DATA:  Fish hook to thumb EXAM: LEFT THUMB 2+V COMPARISON:  None FINDINGS: No fracture or malalignment. Metallic a within the superficial soft tissues along the volar aspect of the thumb at the level of the IP joint. IMPRESSION: 1. No acute fracture 2. Metallic foreign body within the volar soft tissues of the first digit. Electronically Signed   By: Jasmine Pang M.D.   On: 01/24/2018 13:59    ____________________________________________   PROCEDURES  Procedure(s) performed:   .Foreign Body Removal Date/Time: 01/24/2018 2:35 PM Performed by: Tommi Rumps, PA-C Authorized by: Tommi Rumps, PA-C  Consent: Verbal consent obtained. Consent given by: patient Patient understanding: patient states understanding of the procedure being performed Patient consent: the patient's understanding of the procedure matches consent given Imaging studies: imaging studies available Patient identity confirmed: verbally with patient Body area: skin General location: upper extremity Location details: left thumb Anesthesia: digital block  Anesthesia: Local Anesthetic: lidocaine 1% without epinephrine  Sedation: Patient sedated: no  Patient restrained: no Patient cooperative: yes Removal mechanism: scalpel, hemostat and irrigation Dressing: antibiotic ointment Tendon involvement: none Depth: subcutaneous 1 objects recovered. Post-procedure assessment: foreign body removed    Critical Care performed: No  ____________________________________________   INITIAL IMPRESSION / ASSESSMENT AND PLAN / ED COURSE  As part of my medical decision making, I reviewed the following data within the electronic MEDICAL RECORD NUMBER Notes from prior ED visits and [place] Controlled Substance Database  Foreign body was removed completely without any difficulties.  Patient is instructed to clean daily with mild soap and water and watch for any signs of infection.  She is to start on Keflex 500 mg 3 times daily for 7 days and also take ibuprofen as needed for pain.  Patient states that she had a tetanus February/2018 that was verified by her PCP.  She is to follow-up with [facility] if any continued problems. ____________________________________________   FINAL CLINICAL IMPRESSION(S) / ED DIAGNOSES  Final diagnoses:  Foreign body (FB) in soft tissue     ED Discharge Orders         Ordered    cephALEXin (KEFLEX) 500 MG capsule  3 times daily,   Status:  Discontinued     01/24/18 1428    ibuprofen (ADVIL,MOTRIN) 600 MG tablet  Every 8 hours PRN,   Status:  Discontinued     01/24/18 1428    cephALEXin (KEFLEX) 500 MG capsule  3 times daily     01/24/18 1432    ibuprofen (ADVIL,MOTRIN) 600 MG tablet  Every 8 hours PRN     01/24/18 1432       Note:  This document was prepared using Dragon voice recognition software and may include unintentional dictation errors.    Tommi Rumps, PA-C 01/24/18 1516    Schaevitz, Myra Rude, MD 01/24/18 [phone]

## 2018-01-24 NOTE — Discharge Instructions (Addendum)
Follow-up with your primary care provider if any continued problems.  [facility] [address]  Begin taking antibiotics today.  Also clean the wound twice a day with mild soap and water.  Watch for any signs of infection.  Verify with your PCP that your tetanus was in February 2018.

## 2018-08-03 ENCOUNTER — Other Ambulatory Visit: Payer: Self-pay

## 2018-08-03 ENCOUNTER — Encounter: Payer: Self-pay | Admitting: Emergency Medicine

## 2018-08-03 ENCOUNTER — Emergency Department
Admission: EM | Admit: 2018-08-03 | Discharge: 2018-08-03 | Disposition: A | Discharge status: Home / Self Care | Payer: Medicaid Other | Attending: Emergency Medicine | Admitting: Emergency Medicine

## 2018-08-03 DIAGNOSIS — F17210 Nicotine dependence, cigarettes, uncomplicated: Secondary | ICD-10-CM | POA: Insufficient documentation

## 2018-08-03 DIAGNOSIS — L03317 Cellulitis of buttock: Secondary | ICD-10-CM | POA: Insufficient documentation

## 2018-08-03 MED ORDER — SULFAMETHOXAZOLE-TRIMETHOPRIM 800-160 MG PO TABS
1.0000 | ORAL_TABLET | Freq: Once | ORAL | Status: AC
  Administered 2018-08-03: 1 via ORAL
  Filled 2018-08-03: qty 1

## 2018-08-03 MED ORDER — SULFAMETHOXAZOLE-TRIMETHOPRIM 800-160 MG PO TABS: 1.0000 | ORAL_TABLET | Freq: Two times a day (BID) | ORAL | 0 refills | Status: DC

## 2018-08-03 NOTE — ED Provider Notes (Signed)
[facility] Emergency Department Provider Note ____________________________________________  Time seen: 1039  I have reviewed the triage vital signs and the nursing notes.  HISTORY  Chief Complaint  Insect Bite  HPI Darlene Kelley is a 23 y.o. female presents herself to the ED for evaluation which she assumed was an insect bite to the left buttocks.  Patient describes 2 days ago she had some itching to the buttocks at this time is had some increased redness and tenderness.  She denies any spontaneous purulent drainage, but notes a small pustule to the gluteal cleft.  She denies any previous history of abscess, cellulitis, or pilonidal cyst.  She presents now for further evaluation, denying any interim fevers, chills, or sweats.  Past Medical History:  Diagnosis Date  . GERD (gastroesophageal reflux disease)     Patient Active Problem List   Diagnosis Date Noted  . Encounter for supervision of other normal pregnancy 05/28/2016  . PROM (premature rupture of membranes) 05/28/2016    Past Surgical History:  Procedure Laterality Date  . WISDOM TOOTH EXTRACTION      Prior to Admission medications   Medication Sig Start Date End Date Taking? Authorizing Provider  sulfamethoxazole-trimethoprim (BACTRIM DS,SEPTRA DS) 800-160 MG tablet Take 1 tablet by mouth 2 (two) times daily. 08/03/18   Daniel Johndrow, Charlesetta Ivory, PA-C    Allergies Patient has no known allergies.  No family history on file.  Social History Social History   Tobacco Use  . Smoking status: Current Every Day Smoker    Packs/day: 0.25    Years: 4.00    Pack years: 1.00    Types: Cigarettes  . Smokeless tobacco: Never Used  Substance Use Topics  . Alcohol use: No    Comment: Occasional  . Drug use: No    Review of Systems  Constitutional: Negative for fever. Cardiovascular: Negative for chest pain. Respiratory: Negative for shortness of breath. Gastrointestinal: Negative for abdominal  pain, vomiting and diarrhea. Genitourinary: Negative for dysuria. Musculoskeletal: Negative for back pain. Skin: Negative for rash.  Left buttocks tenderness as above. Neurological: Negative for headaches, focal weakness or numbness. ____________________________________________  PHYSICAL EXAM:  VITAL SIGNS: ED Triage Vitals  Enc Vitals Group     BP 08/03/18 0903 117/78     Pulse Rate 08/03/18 0903 95     Resp 08/03/18 0903 18     Temp 08/03/18 0903 98.1 F (36.7 C)     Temp Source 08/03/18 0903 Oral     SpO2 08/03/18 0903 100 %     Weight 08/03/18 0905 120 lb (54.4 kg)     Height 08/03/18 0905 5\' 6"  (1.676 m)     Head Circumference --      Peak Flow --      Pain Score 08/03/18 0905 10     Pain Loc --      Pain Edu? --      Excl. in GC? --     Constitutional: Alert and oriented. Well appearing and in no distress. Head: Normocephalic and atraumatic. Eyes: Conjunctivae are normal. Normal extraocular movements Cardiovascular: Normal rate, regular rhythm. Normal distal pulses. Respiratory: Normal respiratory effort.  Musculoskeletal: Nontender with normal range of motion in all extremities.  Neurologic:  Normal gait without ataxia. Normal speech and language. No gross focal neurologic deficits are appreciated. Skin:  Skin is warm, dry and intact. No rash noted.  Left buttocks with a focal area of subcutaneous erythema extending into the gluteal cleft.  There is  a single shallow pustule intact to the medial rocks.  No firm subcutaneous induration is appreciated.  No spontaneous purulent drainage, punctum, or pointing fluctuance is noted. ____________________________________________  PROCEDURES  Procedures Bactrim DS 1 p.o. ____________________________________________  INITIAL IMPRESSION / ASSESSMENT AND PLAN / ED COURSE  Patient with ED evaluation of left buttocks tenderness concerning for an insect bite.  Patient's clinical picture reveals a subcutaneous erythema without a  focal abscess formation.  Patient be treated empirically for cellulitis using Bactrim DS.  She will be discharged with a prescription for the same, and given wound care instructions.  She will return to the ED for any development of a drainable abscess as discussed. ____________________________________________  FINAL CLINICAL IMPRESSION(S) / ED DIAGNOSES  Final diagnoses:  Cellulitis of buttock      Karmen Stabs, Charlesetta Ivory, PA-C 08/03/18 1828    Emily Filbert, MD 08/04/18 [phone]

## 2018-08-03 NOTE — Discharge Instructions (Signed)
You have a small abscess and cellulitis to the buttock. Take the antibiotic as directed, until all pill are gone. Apply warm compresses to promote healing.

## 2018-08-03 NOTE — ED Triage Notes (Signed)
Possible insect bite noted 3 days ago L hip.

## 2018-08-03 NOTE — ED Notes (Signed)
Pt has bite located on left bottom butt cheek.

## 2018-08-07 ENCOUNTER — Encounter: Payer: Self-pay | Admitting: Emergency Medicine

## 2018-08-07 ENCOUNTER — Emergency Department
Admission: EM | Admit: 2018-08-07 | Discharge: 2018-08-07 | Disposition: A | Discharge status: Home / Self Care | Payer: Self-pay | Attending: Student in an Organized Health Care Education/Training Program | Admitting: Student in an Organized Health Care Education/Training Program

## 2018-08-07 ENCOUNTER — Other Ambulatory Visit: Payer: Self-pay

## 2018-08-07 DIAGNOSIS — F17210 Nicotine dependence, cigarettes, uncomplicated: Secondary | ICD-10-CM | POA: Insufficient documentation

## 2018-08-07 DIAGNOSIS — K529 Noninfective gastroenteritis and colitis, unspecified: Secondary | ICD-10-CM | POA: Insufficient documentation

## 2018-08-07 LAB — URINALYSIS, COMPLETE (UACMP) WITH MICROSCOPIC
Bacteria, UA: NONE SEEN
Bilirubin Urine: NEGATIVE
Glucose, UA: NEGATIVE mg/dL
Ketones, ur: NEGATIVE mg/dL
LEUKOCYTE UA: NEGATIVE
NITRITE: NEGATIVE
PH: 8 (ref 5.0–8.0)
Protein, ur: 30 mg/dL — AB
SPECIFIC GRAVITY, URINE: 1.024 (ref 1.005–1.030)

## 2018-08-07 MED ORDER — ONDANSETRON 4 MG PO TBDP: 4.0000 mg | ORAL_TABLET | Freq: Three times a day (TID) | ORAL | 0 refills | Status: DC | PRN

## 2018-08-07 MED ORDER — ONDANSETRON 4 MG PO TBDP
4.0000 mg | ORAL_TABLET | Freq: Once | ORAL | Status: AC
  Administered 2018-08-07: 4 mg via ORAL
  Filled 2018-08-07: qty 1

## 2018-08-07 NOTE — ED Triage Notes (Signed)
Patient states she had sore throat yesterday, better now.  Diarrhea and vomiting started last PM estimates 4X diarrhea describes as loose stools.  Emesis X 3 last PM.  Here this AM complaining of generalized body aches and chills.  Adult mask placed on patient.

## 2018-08-07 NOTE — ED Notes (Signed)
Pt declines VS recheck

## 2018-08-07 NOTE — Discharge Instructions (Addendum)
Follow-up with your regular doctor if not better in 3 days.  Return emergency department if you are worsening.  Drink plenty of fluids.  Water and ginger ale are good options.  Gatorade is also good.  Popsicles if you are unable to tolerate liquids.  Use the Zofran ODT for any nausea or vomiting.  Buy over-the-counter Imodium ad for the diarrhea.  You should not return to work until you have been free of diarrhea for 24 hours.  Also wipe your bathroom down with a bleach product.  It is very contagious he should wipe the toilet down after each episode of diarrhea.

## 2018-08-07 NOTE — ED Provider Notes (Signed)
[facility] Emergency Department Provider Note  ____________________________________________   First MD Initiated Contact with Patient 08/07/18 1106     (approximate)  I have reviewed the triage vital signs and the nursing notes.   HISTORY  Chief Complaint Sore Throat; Chills; Generalized Body Aches; and Diarrhea    HPI Darlene Kelley is a 23 y.o. female presents emergency department  with c/o vomiting and diarrhea, sx for 1 days, no fever/but states she had chills, complains of body aches, no abd pain except for cramping with diarrhea; denies cp/sob, denies camping, bad food,  or exposure to bad water, recent antibiotics on 08/03/2018 for cellulitis   Past Medical History:  Diagnosis Date  . GERD (gastroesophageal reflux disease)     Patient Active Problem List   Diagnosis Date Noted  . Encounter for supervision of other normal pregnancy 05/28/2016  . PROM (premature rupture of membranes) 05/28/2016    Past Surgical History:  Procedure Laterality Date  . WISDOM TOOTH EXTRACTION      Prior to Admission medications   Medication Sig Start Date End Date Taking? Authorizing Provider  ondansetron (ZOFRAN-ODT) 4 MG disintegrating tablet Take 1 tablet (4 mg total) by mouth every 8 (eight) hours as needed. 08/07/18   Faythe Ghee, PA-C    Allergies Patient has no known allergies.  No family history on file.  Social History Social History   Tobacco Use  . Smoking status: Current Every Day Smoker    Packs/day: 0.25    Years: 4.00    Pack years: 1.00    Types: Cigarettes  . Smokeless tobacco: Never Used  Substance Use Topics  . Alcohol use: No    Comment: Occasional  . Drug use: No    Review of Systems  Constitutional: No fever/positive chills Eyes: No visual changes. ENT: No sore throat. Respiratory: Denies cough Gastrointestinal: Positive for vomiting and diarrhea Genitourinary: Negative for dysuria. Musculoskeletal: Negative for  back pain. Skin: Negative for rash.    ____________________________________________   PHYSICAL EXAM:  VITAL SIGNS: ED Triage Vitals  Enc Vitals Group     BP 08/07/18 1044 113/62     Pulse Rate 08/07/18 1044 (!) 110     Resp 08/07/18 1044 16     Temp 08/07/18 1044 98.7 F (37.1 C)     Temp Source 08/07/18 1044 Oral     SpO2 08/07/18 1044 99 %     Weight 08/07/18 1045 120 lb (54.4 kg)     Height 08/07/18 1045 5\' 6"  (1.676 m)     Head Circumference --      Peak Flow --      Pain Score 08/07/18 1045 10     Pain Loc --      Pain Edu? --      Excl. in GC? --     Constitutional: Alert and oriented. Well appearing and in no acute distress. Eyes: Conjunctivae are normal.  Head: Atraumatic. Nose: No congestion/rhinnorhea. Mouth/Throat: Mucous membranes are moist.   Neck:  supple no lymphadenopathy noted Cardiovascular: Normal rate, regular rhythm. Heart sounds are normal Respiratory: Normal respiratory effort.  No retractions, lungs c t a  Abd: soft nontender bs normal all 4 quad GU: deferred Musculoskeletal: FROM all extremities, warm and well perfused Neurologic:  Normal speech and language.  Skin:  Skin is warm, dry and intact. No rash noted. Psychiatric: Mood and affect are normal. Speech and behavior are normal.  ____________________________________________   LABS (all labs ordered are  listed, but only abnormal results are displayed)  Labs Reviewed  URINALYSIS, COMPLETE (UACMP) WITH MICROSCOPIC - Abnormal; Notable for the following components:      Result Value   Color, Urine YELLOW (*)    APPearance CLEAR (*)    Hgb urine dipstick SMALL (*)    Protein, ur 30 (*)    All other components within normal limits  POC URINE PREG, ED   ____________________________________________   ____________________________________________  RADIOLOGY    ____________________________________________   PROCEDURES  Procedure(s) performed: Zofran  ODT  Procedures    ____________________________________________   INITIAL IMPRESSION / ASSESSMENT AND PLAN / ED COURSE  Pertinent labs & imaging results that were available during my care of the patient were reviewed by me and considered in my medical decision making (see chart for details).   Patient is a 23 year old female presents emergency department with vomiting and diarrhea.  Physical exam shows she is afebrile.  Abdomen is not tender.  Remainder exam is unremarkable  UA is normal POC pregnancy is negative  Zofran 4 mg ODT was given to the patient.  This is controlled her nausea and vomiting.  She was able to retain fluids.  Therefore she was discharged in stable condition.  She was given a work note for today and tomorrow.  Prescription for more Zofran.  Encouraged to take Imodium for the diarrhea as needed.     As part of my medical decision making, I reviewed the following data within the electronic MEDICAL RECORD NUMBER Nursing notes reviewed and incorporated, Labs reviewed POC pregnancy is negative, UA is normal, Notes from prior ED visits and [place] Controlled Substance Database  ____________________________________________   FINAL CLINICAL IMPRESSION(S) / ED DIAGNOSES  Final diagnoses:  Gastroenteritis      NEW MEDICATIONS STARTED DURING THIS VISIT:  Discharge Medication List as of 08/07/2018 12:25 PM    START taking these medications   Details  ondansetron (ZOFRAN-ODT) 4 MG disintegrating tablet Take 1 tablet (4 mg total) by mouth every 8 (eight) hours as needed., Starting Wed 08/07/2018, Normal         Note:  This document was prepared using Dragon voice recognition software and may include unintentional dictation errors.     Faythe Ghee, PA-C 08/07/18 1417    Willy Eddy, MD 08/07/18 [id number]

## 2018-08-07 NOTE — ED Notes (Signed)
See triage note  States she had some n/v/d yesterday with chills  Also had sore throat  This am denies sore throat but had body aches this am  Afebrile on arrival

## 2018-08-07 NOTE — ED Notes (Signed)
POC urine was neg

## 2018-09-10 ENCOUNTER — Other Ambulatory Visit: Payer: Self-pay

## 2018-09-10 ENCOUNTER — Emergency Department
Admission: EM | Admit: 2018-09-10 | Discharge: 2018-09-10 | Disposition: A | Discharge status: Home / Self Care | Payer: Self-pay | Attending: Emergency Medicine | Admitting: Emergency Medicine

## 2018-09-10 ENCOUNTER — Encounter: Payer: Self-pay | Admitting: Emergency Medicine

## 2018-09-10 DIAGNOSIS — F17210 Nicotine dependence, cigarettes, uncomplicated: Secondary | ICD-10-CM | POA: Insufficient documentation

## 2018-09-10 DIAGNOSIS — B9789 Other viral agents as the cause of diseases classified elsewhere: Secondary | ICD-10-CM

## 2018-09-10 DIAGNOSIS — J069 Acute upper respiratory infection, unspecified: Secondary | ICD-10-CM | POA: Insufficient documentation

## 2018-09-10 NOTE — Discharge Instructions (Addendum)
Your exam and symptoms are consistent with a likely viral upper respiratory infection. Take OTC cough medicine (Delsym & NyQuil) for symptom relief. Drink plenty of fluids and rest as necessary. Follow-up with [facility] for ongoing symptoms. Return to the ED or your provider as needed.

## 2018-09-10 NOTE — ED Notes (Signed)
See triage note  Presents with sore throat since yesterday  Had low grade fever yesterday and this am  Afebrile on arrival

## 2018-09-10 NOTE — ED Provider Notes (Signed)
[facility] Emergency Department Provider Note ____________________________________________  Time seen: 1050  I have reviewed the triage vital signs and the nursing notes.  HISTORY  Chief Complaint  Fever; Cough; and Sore Throat  History as told Hunt Oris, PA-S Sherrie Sport)  HPI Darlene Kelley is a 23 y.o. female presents to the ED from work, for evaluation of suspected fevers.  Patient describes an onset of mild cough over the last 24 hours.  She works in [employer], so her employer to the ED for further evaluation.  Patient describes onset of shortness of breath and is near syncopal episode while at work today.  She believes she got overheated in the kitchen.  When the employer rep checked her temperature she was found to be 99 F.  They sent her to the ED for further evaluation of her symptoms, and medical clearance to return to work. She describes a scratchy throat as well as a nonproductive cough.  She denies any sick or high risk contacts, recent travel, or other exposures.  Past Medical History:  Diagnosis Date  . GERD (gastroesophageal reflux disease)     Patient Active Problem List   Diagnosis Date Noted  . Encounter for supervision of other normal pregnancy 05/28/2016  . PROM (premature rupture of membranes) 05/28/2016    Past Surgical History:  Procedure Laterality Date  . WISDOM TOOTH EXTRACTION      Prior to Admission medications   Not on File    Allergies Patient has no known allergies.  No family history on file.  Social History Social History   Tobacco Use  . Smoking status: Current Every Day Smoker    Packs/day: 0.25    Years: 4.00    Pack years: 1.00    Types: Cigarettes  . Smokeless tobacco: Never Used  Substance Use Topics  . Alcohol use: No    Comment: Occasional  . Drug use: No    Review of Systems  Constitutional: Negative for fever. Eyes: Negative for visual changes. ENT: Positive for sore  throat. Cardiovascular: Negative for chest pain. Respiratory: Negative for shortness of breath. Reports mild cough Gastrointestinal: Negative for abdominal pain, vomiting and diarrhea. Genitourinary: Negative for dysuria. Musculoskeletal: Negative for back pain. Skin: Negative for rash. Neurological: Negative for headaches, focal weakness or numbness. ____________________________________________  PHYSICAL EXAM:  VITAL SIGNS: ED Triage Vitals  Enc Vitals Group     BP 09/10/18 1024 121/78     Pulse Rate 09/10/18 1024 88     Resp 09/10/18 1024 15     Temp 09/10/18 1024 98.7 F (37.1 C)     Temp Source 09/10/18 1024 Oral     SpO2 09/10/18 1024 100 %     Weight 09/10/18 1025 120 lb (54.4 kg)     Height 09/10/18 1025 5\' 6"  (1.676 m)     Head Circumference --      Peak Flow --      Pain Score 09/10/18 1039 0     Pain Loc --      Pain Edu? --      Excl. in GC? --     Constitutional: Alert and oriented. Well appearing and in no distress. Head: Normocephalic and atraumatic. Eyes: Conjunctivae are normal. Normal extraocular movements Ears: Canals clear. TMs intact bilaterally. Nose: No congestion/rhinorrhea/epistaxis. Mouth/Throat: Mucous membranes are moist.  Uvula is midline and tonsils are flat.  No oropharyngeal lesions appreciated. Neck: Supple. No thyromegaly. Hematological/Lymphatic/Immunological: No cervical lymphadenopathy. Cardiovascular: Normal rate, regular rhythm. Normal  distal pulses. Respiratory: Normal respiratory effort. No wheezes/rales/rhonchi. Musculoskeletal: Nontender with normal range of motion in all extremities.  Neurologic:  Normal gait without ataxia. Normal speech and language. No gross focal neurologic deficits are appreciated. Skin:  Skin is warm, dry and intact. No rash noted. ____________________________________________  PROCEDURES  Procedures ____________________________________________  INITIAL IMPRESSION / ASSESSMENT AND PLAN / ED  COURSE  Patient with ED evaluation following her employer's request for follow-up.  Patient has had a less than 24-hour complaint of mild cough and some sinus pressure.  She denies any fevers, chills, or sweats.  She is found to be afebrile on presentation with a benign exam.  Patient symptoms may likely represent a viral etiology versus an allergic component.  She is discharged with instructions to take over-the-counter allergy medicines for symptom relief.  She will follow-up with primary provider or return to the ED as needed.  A work note provided for today as requested. ____________________________________________  FINAL CLINICAL IMPRESSION(S) / ED DIAGNOSES  Final diagnoses:  Viral URI with cough      Jemmie Rhinehart, Charlesetta Ivory, PA-C 09/10/18 1203    Rockne Menghini, MD 09/10/18 1547

## 2018-09-10 NOTE — ED Triage Notes (Signed)
Patient states she works at [employer] and they checked her temp this AM and it was 99.  Patient complaining of cough and sore throat.

## 2018-09-10 NOTE — ED Triage Notes (Signed)
Patient given a face mask to wear because of cough.

## 2018-09-16 ENCOUNTER — Other Ambulatory Visit: Payer: Self-pay

## 2018-09-16 ENCOUNTER — Emergency Department
Admission: EM | Admit: 2018-09-16 | Discharge: 2018-09-16 | Disposition: A | Discharge status: Home / Self Care | Payer: Self-pay | Attending: Emergency Medicine | Admitting: Emergency Medicine

## 2018-09-16 ENCOUNTER — Encounter: Payer: Self-pay | Admitting: *Deleted

## 2018-09-16 DIAGNOSIS — Z79899 Other long term (current) drug therapy: Secondary | ICD-10-CM | POA: Insufficient documentation

## 2018-09-16 DIAGNOSIS — F17210 Nicotine dependence, cigarettes, uncomplicated: Secondary | ICD-10-CM | POA: Insufficient documentation

## 2018-09-16 DIAGNOSIS — J029 Acute pharyngitis, unspecified: Secondary | ICD-10-CM | POA: Insufficient documentation

## 2018-09-16 MED ORDER — PROMETHAZINE-DM 6.25-15 MG/5ML PO SYRP: 5.0000 mL | ORAL_SOLUTION | Freq: Four times a day (QID) | ORAL | 0 refills | Status: DC | PRN

## 2018-09-16 MED ORDER — LIDOCAINE VISCOUS HCL 2 % MT SOLN: 5.0000 mL | Freq: Four times a day (QID) | OROMUCOSAL | 0 refills | Status: DC | PRN

## 2018-09-16 NOTE — ED Provider Notes (Signed)
[facility] Emergency Department Provider Note   ____________________________________________   None    (approximate)  I have reviewed the triage vital signs and the nursing notes.   HISTORY  Chief Complaint Sore Throat    HPI Darlene Kelley is a 23 y.o. female.  Patient state cough and sore throat which began this morning.  Patient states symptoms occurred after she reported to work.  Patient denies fever chills associated complaint.  Patient denies nausea, vomiting, diarrhea.  Patient denies recent travel contact or recent travel.  Patient rates the pain as a 7/10.  Patient described the pain as "sore".  No palliative measure for complaint.         Past Medical History:  Diagnosis Date  . GERD (gastroesophageal reflux disease)     Patient Active Problem List   Diagnosis Date Noted  . Encounter for supervision of other normal pregnancy 05/28/2016  . PROM (premature rupture of membranes) 05/28/2016    Past Surgical History:  Procedure Laterality Date  . WISDOM TOOTH EXTRACTION      Prior to Admission medications   Medication Sig Start Date End Date Taking? Authorizing Provider  metroNIDAZOLE (METROGEL) 0.75 % vaginal gel Place 1 Applicatorful vaginally 2 (two) times a week. 08/24/18  Yes [provider]  lidocaine (XYLOCAINE) 2 % solution Use as directed 5 mLs in the mouth or throat every 6 (six) hours as needed for mouth pain. 09/16/18   Joni Reining, PA-C  promethazine-dextromethorphan (PROMETHAZINE-DM) 6.25-15 MG/5ML syrup Take 5 mLs by mouth 4 (four) times daily as needed for cough. Mix with Viscous Lidocaine for swish and swallow 09/16/18   Joni Reining, PA-C    Allergies Patient has no known allergies.  No family history on file.  Social History Social History   Tobacco Use  . Smoking status: Current Every Day Smoker    Packs/day: 0.25    Years: 4.00    Pack years: 1.00    Types: Cigarettes  . Smokeless tobacco:  Never Used  Substance Use Topics  . Alcohol use: No    Comment: Occasional  . Drug use: No    Review of Systems Constitutional: No fever/chills Eyes: No visual changes. ENT: Sore throat. Cardiovascular: Denies chest pain. Respiratory: Denies shortness of breath.  Nonproductive cough. Gastrointestinal: No abdominal pain.  No nausea, no vomiting.  No diarrhea.  No constipation. Genitourinary: Negative for dysuria. Musculoskeletal: Negative for back pain. Skin: Negative for rash. Neurological: Negative for headaches, focal weakness or numbness.   ____________________________________________   PHYSICAL EXAM:  VITAL SIGNS: ED Triage Vitals  Enc Vitals Group     BP 09/16/18 1226 128/68     Pulse Rate 09/16/18 1226 96     Resp 09/16/18 1226 16     Temp 09/16/18 1226 98.5 F (36.9 C)     Temp Source 09/16/18 1226 Oral     SpO2 09/16/18 1226 100 %     Weight 09/16/18 1227 118 lb (53.5 kg)     Height 09/16/18 1227 5\' 6"  (1.676 m)     Head Circumference --      Peak Flow --      Pain Score 09/16/18 1227 7     Pain Loc --      Pain Edu? --      Excl. in GC? --    Constitutional: Alert and oriented. Well appearing and in no acute distress. Nose: No congestion/rhinnorhea. Mouth/Throat: Mucous membranes are moist.  Oropharynx non-erythematous. Neck:  No stridor.   Hematological/Lymphatic/Immunilogical: No cervical lymphadenopathy. Cardiovascular: Normal rate, regular rhythm. Grossly normal heart sounds.  Good peripheral circulation. Respiratory: Normal respiratory effort.  No retractions. Lungs CTAB. Skin:  Skin is warm, dry and intact. No rash noted. Psychiatric: Mood and affect are normal. Speech and behavior are normal.  ____________________________________________   LABS (all labs ordered are listed, but only abnormal results are displayed)  Labs Reviewed - No data to display ____________________________________________  EKG    ____________________________________________  RADIOLOGY  ED MD interpretation:    Official radiology report(s): No results found.  ____________________________________________   PROCEDURES  Procedure(s) performed (including Critical Care):  Procedures   ____________________________________________   INITIAL IMPRESSION / ASSESSMENT AND PLAN / ED COURSE  As part of my medical decision making, I reviewed the following data within the electronic MEDICAL RECORD NUMBER         Patient presents with sudden onset of cough and sore throat.  Patient is exam is consistent with viral pharyngitis and upper respiratory infection.  Patient given discharge care instructions and a work note.  Patient advised take medication as directed.  Patient will [facility] condition persist.      ____________________________________________   FINAL CLINICAL IMPRESSION(S) / ED DIAGNOSES  Final diagnoses:  Viral pharyngitis     ED Discharge Orders         Ordered    promethazine-dextromethorphan (PROMETHAZINE-DM) 6.25-15 MG/5ML syrup  4 times daily PRN     09/16/18 1236    lidocaine (XYLOCAINE) 2 % solution  Every 6 hours PRN     09/16/18 1236           Note:  This document was prepared using Dragon voice recognition software and may include unintentional dictation errors.    Joni Reining, PA-C 09/16/18 1308    Sharyn Creamer, MD 09/16/18 2218

## 2018-09-16 NOTE — ED Triage Notes (Signed)
Per patient's report, patient was at work and experienced sore throat and coughing after equipment was cleaned. Patient felt fine before going to work.

## 2018-11-02 ENCOUNTER — Emergency Department
Admission: EM | Admit: 2018-11-02 | Discharge: 2018-11-02 | Disposition: A | Discharge status: Home / Self Care | Payer: Self-pay | Attending: Emergency Medicine | Admitting: Emergency Medicine

## 2018-11-02 ENCOUNTER — Emergency Department: Payer: Self-pay

## 2018-11-02 ENCOUNTER — Encounter: Payer: Self-pay | Admitting: Emergency Medicine

## 2018-11-02 ENCOUNTER — Other Ambulatory Visit: Payer: Self-pay

## 2018-11-02 DIAGNOSIS — L03213 Periorbital cellulitis: Secondary | ICD-10-CM | POA: Insufficient documentation

## 2018-11-02 DIAGNOSIS — L0291 Cutaneous abscess, unspecified: Secondary | ICD-10-CM

## 2018-11-02 DIAGNOSIS — L0201 Cutaneous abscess of face: Secondary | ICD-10-CM | POA: Insufficient documentation

## 2018-11-02 LAB — CBC WITH DIFFERENTIAL/PLATELET
Abs Immature Granulocytes: 0.04 10*3/uL (ref 0.00–0.07)
Basophils Absolute: 0 10*3/uL (ref 0.0–0.1)
Basophils Relative: 0 %
Eosinophils Absolute: 0.2 10*3/uL (ref 0.0–0.5)
Eosinophils Relative: 2 %
HCT: 35.3 % — ABNORMAL LOW (ref 36.0–46.0)
Hemoglobin: 12 g/dL (ref 12.0–15.0)
Immature Granulocytes: 0 %
Lymphocytes Relative: 23 %
Lymphs Abs: 2.5 10*3/uL (ref 0.7–4.0)
MCH: 30.2 pg (ref 26.0–34.0)
MCHC: 34 g/dL (ref 30.0–36.0)
MCV: 88.9 fL (ref 80.0–100.0)
Monocytes Absolute: 0.8 10*3/uL (ref 0.1–1.0)
Monocytes Relative: 7 %
Neutro Abs: 7.5 10*3/uL (ref 1.7–7.7)
Neutrophils Relative %: 68 %
Platelets: 233 10*3/uL (ref 150–400)
RBC: 3.97 MIL/uL (ref 3.87–5.11)
RDW: 11.7 % (ref 11.5–15.5)
WBC: 11.1 10*3/uL — ABNORMAL HIGH (ref 4.0–10.5)
nRBC: 0 % (ref 0.0–0.2)

## 2018-11-02 LAB — BASIC METABOLIC PANEL
Anion gap: 5 (ref 5–15)
BUN: 11 mg/dL (ref 6–20)
CO2: 26 mmol/L (ref 22–32)
Calcium: 9.3 mg/dL (ref 8.9–10.3)
Chloride: 107 mmol/L (ref 98–111)
Creatinine, Ser: 0.57 mg/dL (ref 0.44–1.00)
GFR calc Af Amer: >60 mL/min (ref >60)
GFR calc non Af Amer: >60 mL/min (ref >60)
Glucose, Bld: 91 mg/dL (ref 70–99)
Potassium: 3.4 mmol/L — ABNORMAL LOW (ref 3.5–5.1)
Sodium: 138 mmol/L (ref 135–145)

## 2018-11-02 MED ORDER — SULFAMETHOXAZOLE-TRIMETHOPRIM 800-160 MG PO TABS: 1.0000 | ORAL_TABLET | Freq: Two times a day (BID) | ORAL | 0 refills | Status: DC

## 2018-11-02 MED ORDER — AMOXICILLIN-POT CLAVULANATE 875-125 MG PO TABS
1.0000 | ORAL_TABLET | Freq: Once | ORAL | Status: AC
  Administered 2018-11-02: 15:00:00 1 via ORAL
  Filled 2018-11-02: qty 1

## 2018-11-02 MED ORDER — ONDANSETRON HCL 4 MG/2ML IJ SOLN
4.0000 mg | Freq: Once | INTRAMUSCULAR | Status: AC
  Administered 2018-11-02: 4 mg via INTRAVENOUS
  Filled 2018-11-02: qty 2

## 2018-11-02 MED ORDER — CEPHALEXIN 500 MG PO CAPS: 500.0000 mg | ORAL_CAPSULE | Freq: Three times a day (TID) | ORAL | 0 refills | Status: DC

## 2018-11-02 MED ORDER — LIDOCAINE HCL (PF) 1 % IJ SOLN
5.0000 mL | Freq: Once | INTRAMUSCULAR | Status: DC
  Filled 2018-11-02: qty 5

## 2018-11-02 MED ORDER — IOHEXOL 300 MG/ML  SOLN
75.0000 mL | Freq: Once | INTRAMUSCULAR | Status: AC | PRN
  Administered 2018-11-02: 75 mL via INTRAVENOUS
  Filled 2018-11-02: qty 75

## 2018-11-02 MED ORDER — MORPHINE SULFATE (PF) 4 MG/ML IV SOLN
4.0000 mg | Freq: Once | INTRAVENOUS | Status: AC
  Administered 2018-11-02: 4 mg via INTRAVENOUS
  Filled 2018-11-02: qty 1

## 2018-11-02 MED ORDER — CLINDAMYCIN PHOSPHATE 600 MG/50ML IV SOLN
600.0000 mg | Freq: Once | INTRAVENOUS | Status: AC
  Administered 2018-11-02: 600 mg via INTRAVENOUS
  Filled 2018-11-02: qty 50

## 2018-11-02 MED ORDER — HYDROCODONE-ACETAMINOPHEN 5-325 MG PO TABS: 1.0000 | ORAL_TABLET | Freq: Four times a day (QID) | ORAL | 0 refills | Status: DC | PRN

## 2018-11-02 NOTE — ED Notes (Addendum)
Pt states "knot" that appeared on ear and L eyelid about a week ago. States they hurt but denies changes in vision or hearing. Knot visible at L eyebrow and where L ear and cheek meet. Swollen. Knot at eyebrow taut and soft at ear.

## 2018-11-02 NOTE — ED Triage Notes (Signed)
L earache x 2 days. Lump L eyebrow x 1 week.

## 2018-11-02 NOTE — Discharge Instructions (Addendum)
Follow-up with your regular doctor return emergency department if not improving to 3 days.  Return emergency department worsening.  If you develop more swelling, pain, pain with eye movement, or fever please return to the emergency department immediately.  Take your medications as prescribed.  Apply warm compress to the area above the left eye.

## 2018-11-02 NOTE — ED Provider Notes (Signed)
[facility] Emergency Department Provider Note  ____________________________________________   First MD Initiated Contact with Patient 11/02/18 1217     (approximate)  I have reviewed the triage vital signs and the nursing notes.   HISTORY  Chief Complaint Otalgia and Mass    HPI Darlene Kelley is a 23 y.o. female presents emergency department complaining of pain above the left eyelid and a knot at her left ear for about 1 week.  She states the pain has been getting worse.  She states that it is very painful.  She denies any fever or chills.  She denies any drainage from the area.  She states the redness is spread down to the cheek.    Past Medical History:  Diagnosis Date  . GERD (gastroesophageal reflux disease)     Patient Active Problem List   Diagnosis Date Noted  . Encounter for supervision of other normal pregnancy 05/28/2016  . PROM (premature rupture of membranes) 05/28/2016    Past Surgical History:  Procedure Laterality Date  . WISDOM TOOTH EXTRACTION      Prior to Admission medications   Medication Sig Start Date End Date Taking? Authorizing Provider  cephALEXin (KEFLEX) 500 MG capsule Take 1 capsule (500 mg total) by mouth 3 (three) times daily. 11/02/18   Sharen Youngren, Linden Dolin, PA-C  HYDROcodone-acetaminophen (NORCO/VICODIN) 5-325 MG tablet Take 1 tablet by mouth every 6 (six) hours as needed for moderate pain. 11/02/18   Lynell Kussman, Linden Dolin, PA-C  sulfamethoxazole-trimethoprim (BACTRIM DS) 800-160 MG tablet Take 1 tablet by mouth 2 (two) times daily. 11/02/18   Versie Starks, PA-C    Allergies Patient has no known allergies.  No family history on file.  Social History Social History   Tobacco Use  . Smoking status: Current Every Day Smoker    Packs/day: 0.25    Years: 4.00    Pack years: 1.00    Types: Cigarettes  . Smokeless tobacco: Never Used  Substance Use Topics  . Alcohol use: No    Comment: Occasional  . Drug use: No     Review of Systems  Constitutional: No fever/chills Eyes: No visual changes. ENT: No sore throat. Respiratory: Denies cough Genitourinary: Negative for dysuria. Musculoskeletal: Negative for back pain. Skin: Negative for rash.  Positive for redness and abscess to the face    ____________________________________________   PHYSICAL EXAM:  VITAL SIGNS: ED Triage Vitals  Enc Vitals Group     BP 11/02/18 1048 124/80     Pulse Rate 11/02/18 1048 89     Resp 11/02/18 1048 18     Temp 11/02/18 1048 98.5 F (36.9 C)     Temp Source 11/02/18 1048 Oral     SpO2 11/02/18 1048 100 %     Weight 11/02/18 1050 113 lb (51.3 kg)     Height 11/02/18 1050 5' 6"  (1.676 m)     Head Circumference --      Peak Flow --      Pain Score 11/02/18 1050 10     Pain Loc --      Pain Edu? --      Excl. in [place]? --     Constitutional: Alert and oriented. Well appearing and in no acute distress. Eyes: Conjunctivae are normal.  Left upper lid is red and swollen Head: Positive for small cystlike abscess above the left brow, redness and swelling extend to the left parotid area, preauricular node is swollen and tender, Nose: No congestion/rhinnorhea. Mouth/Throat:  Mucous membranes are moist.   Neck:  supple no lymphadenopathy noted Cardiovascular: Normal rate, regular rhythm. Heart sounds are normal Respiratory: Normal respiratory effort.  No retractions, lungs c t a  GU: deferred Musculoskeletal: FROM all extremities, warm and well perfused Neurologic:  Normal speech and language.  Skin:  Skin is warm, dry and intact.  See above for description of cellulitis and abscess Psychiatric: Mood and affect are normal. Speech and behavior are normal.  ____________________________________________   LABS (all labs ordered are listed, but only abnormal results are displayed)  Labs Reviewed  BASIC METABOLIC PANEL - Abnormal; Notable for the following components:      Result Value   Potassium 3.4 (*)    All  other components within normal limits  CBC WITH DIFFERENTIAL/PLATELET - Abnormal; Notable for the following components:   WBC 11.1 (*)    HCT 35.3 (*)    All other components within normal limits  POC URINE PREG, ED   ____________________________________________   ____________________________________________  RADIOLOGY  CT maxillofacial shows a periorbital cellulitis with an abscess noted above the brow  ____________________________________________   PROCEDURES  Procedure(s) performed:   Marland KitchenMarland KitchenIncision and Drainage Date/Time: 11/02/2018 3:19 PM Performed by: Versie Starks, PA-C Authorized by: Versie Starks, PA-C   Consent:    Consent obtained:  Verbal   Consent given by:  Patient   Risks discussed:  Incomplete drainage, infection and pain   Alternatives discussed:  Alternative treatment Location:    Type:  Abscess   Location:  Head   Head location:  Face Pre-procedure details:    Skin preparation:  Betadine Anesthesia (see MAR for exact dosages):    Anesthesia method:  Local infiltration   Local anesthetic:  Lidocaine 1% w/o epi Procedure type:    Complexity:  Simple Procedure details:    Incision types:  Stab incision   Incision depth:  Subcutaneous   Scalpel blade:  11   Wound management:  Probed and deloculated   Drainage:  Bloody and purulent   Drainage amount:  Moderate   Wound treatment:  Wound left open Post-procedure details:    Patient tolerance of procedure:  Tolerated well, no immediate complications      ____________________________________________   INITIAL IMPRESSION / ASSESSMENT AND PLAN / ED COURSE  Pertinent labs & imaging results that were available during my care of the patient were reviewed by me and considered in my medical decision making (see chart for details).   Patient is 23 year old female presents emergency department with redness and swelling to the left side of the face with a small noted abscess above the left brow with  swelling and redness to the left eyelid.  Physical exam shows the same.  CBC has elevated WBC at 93.8, basic metabolic panel is normal  CT maxillofacial shows a large amount of cellulitis with swollen lymph nodes and a abscess above the left brow, diagnosis periorbital cellulitis  Clindamycin 600 mg IV, Augmentin 1 p.o., morphine 4 mg IV  I&D see procedure note  Patient tolerated procedure well.  Dressing was applied by nursing staff.  Patient was given a prescription for Septra and Keflex.  She is to follow-up with her regular doctor if not better in 3 days.  Return to emergency department if worsening.  Signs and symptoms of orbital cellulitis were explained to the patient.  She knows to return if is worsening.  She states she understands all instructions and was discharged stable condition.    As part of  my medical decision making, I reviewed the following data within the [place] notes reviewed and incorporated, Labs reviewed CBC and met B are normal, Old chart reviewed, Radiograph reviewed CT shows periorbital cellulitis with small abscess, Notes from prior ED visits and [place] Controlled Substance Database  ____________________________________________   FINAL CLINICAL IMPRESSION(S) / ED DIAGNOSES  Final diagnoses:  Periorbital cellulitis of left eye  Abscess      NEW MEDICATIONS STARTED DURING THIS VISIT:  New Prescriptions   CEPHALEXIN (KEFLEX) 500 MG CAPSULE    Take 1 capsule (500 mg total) by mouth 3 (three) times daily.   HYDROCODONE-ACETAMINOPHEN (NORCO/VICODIN) 5-325 MG TABLET    Take 1 tablet by mouth every 6 (six) hours as needed for moderate pain.   SULFAMETHOXAZOLE-TRIMETHOPRIM (BACTRIM DS) 800-160 MG TABLET    Take 1 tablet by mouth 2 (two) times daily.     Note:  This document was prepared using Dragon voice recognition software and may include unintentional dictation errors.    Versie Starks, PA-C 11/02/18 1523    Arta Silence, MD 11/02/18 [phone]

## 2018-11-07 ENCOUNTER — Other Ambulatory Visit: Payer: Self-pay

## 2018-11-07 ENCOUNTER — Emergency Department
Admission: EM | Admit: 2018-11-07 | Discharge: 2018-11-07 | Disposition: A | Discharge status: Home / Self Care | Payer: Self-pay | Attending: Emergency Medicine | Admitting: Emergency Medicine

## 2018-11-07 ENCOUNTER — Encounter: Payer: Self-pay | Admitting: Emergency Medicine

## 2018-11-07 DIAGNOSIS — B9689 Other specified bacterial agents as the cause of diseases classified elsewhere: Secondary | ICD-10-CM | POA: Insufficient documentation

## 2018-11-07 DIAGNOSIS — N760 Acute vaginitis: Secondary | ICD-10-CM | POA: Insufficient documentation

## 2018-11-07 DIAGNOSIS — N939 Abnormal uterine and vaginal bleeding, unspecified: Secondary | ICD-10-CM | POA: Insufficient documentation

## 2018-11-07 LAB — CBC WITH DIFFERENTIAL/PLATELET
Abs Immature Granulocytes: 0.02 10*3/uL (ref 0.00–0.07)
Basophils Absolute: 0 10*3/uL (ref 0.0–0.1)
Basophils Relative: 0 %
Eosinophils Absolute: 0.2 10*3/uL (ref 0.0–0.5)
Eosinophils Relative: 3 %
HCT: 36.7 % (ref 36.0–46.0)
Hemoglobin: 12.4 g/dL (ref 12.0–15.0)
Immature Granulocytes: 0 %
Lymphocytes Relative: 34 %
Lymphs Abs: 2.2 10*3/uL (ref 0.7–4.0)
MCH: 29.5 pg (ref 26.0–34.0)
MCHC: 33.8 g/dL (ref 30.0–36.0)
MCV: 87.4 fL (ref 80.0–100.0)
Monocytes Absolute: 0.4 10*3/uL (ref 0.1–1.0)
Monocytes Relative: 6 %
Neutro Abs: 3.7 10*3/uL (ref 1.7–7.7)
Neutrophils Relative %: 57 %
Platelets: 271 10*3/uL (ref 150–400)
RBC: 4.2 MIL/uL (ref 3.87–5.11)
RDW: 11.6 % (ref 11.5–15.5)
WBC: 6.5 10*3/uL (ref 4.0–10.5)
nRBC: 0 % (ref 0.0–0.2)

## 2018-11-07 LAB — COMPREHENSIVE METABOLIC PANEL
ALT: 11 U/L (ref 0–44)
AST: 13 U/L — ABNORMAL LOW (ref 15–41)
Albumin: 3.8 g/dL (ref 3.5–5.0)
Alkaline Phosphatase: 60 U/L (ref 38–126)
Anion gap: 6 (ref 5–15)
BUN: 13 mg/dL (ref 6–20)
CO2: 25 mmol/L (ref 22–32)
Calcium: 9.1 mg/dL (ref 8.9–10.3)
Chloride: 105 mmol/L (ref 98–111)
Creatinine, Ser: 0.76 mg/dL (ref 0.44–1.00)
GFR calc Af Amer: >60 mL/min (ref >60)
GFR calc non Af Amer: >60 mL/min (ref >60)
Glucose, Bld: 89 mg/dL (ref 70–99)
Potassium: 3.7 mmol/L (ref 3.5–5.1)
Sodium: 136 mmol/L (ref 135–145)
Total Bilirubin: 0.9 mg/dL (ref 0.3–1.2)
Total Protein: 7.5 g/dL (ref 6.5–8.1)

## 2018-11-07 LAB — WET PREP, GENITAL
Sperm: NONE SEEN
Trich, Wet Prep: NONE SEEN
Yeast Wet Prep HPF POC: NONE SEEN

## 2018-11-07 LAB — URINALYSIS, COMPLETE (UACMP) WITH MICROSCOPIC
Bacteria, UA: NONE SEEN
Bilirubin Urine: NEGATIVE
Glucose, UA: NEGATIVE mg/dL
Ketones, ur: NEGATIVE mg/dL
Nitrite: NEGATIVE
Protein, ur: NEGATIVE mg/dL
Specific Gravity, Urine: 1.025 (ref 1.005–1.030)
pH: 5 (ref 5.0–8.0)

## 2018-11-07 LAB — CHLAMYDIA/NGC RT PCR (ARMC ONLY)
Chlamydia Tr: NOT DETECTED
N gonorrhoeae: NOT DETECTED

## 2018-11-07 MED ORDER — CLINDAMYCIN PHOSPHATE 100 MG VA SUPP: 100.0000 mg | Freq: Every day | VAGINAL | 0 refills | Status: AC

## 2018-11-07 MED ORDER — DOXYCYCLINE HYCLATE 100 MG PO TABS: 100.0000 mg | ORAL_TABLET | Freq: Two times a day (BID) | ORAL | 0 refills | Status: DC

## 2018-11-07 NOTE — Discharge Instructions (Signed)
Your exam and labs are essentially normal at this time. You will be treated for BV with a vaginal gel. You will be treated with Doxycycline for your abnormal vaginal bleeding. Take the pills as directed, and follow-up with Dr. Letta Pate as needed.

## 2018-11-07 NOTE — ED Triage Notes (Signed)
Pt here with c/o vaginal bleeding for 2.5 weeks, states this hasn't happened before with this birth control she's been on (for 2 years now) blood clots as well, lower back pain and cramping as well. States she doesn't think she could be pregnant. NAD.

## 2018-11-07 NOTE — ED Notes (Signed)

## 2018-11-07 NOTE — ED Provider Notes (Signed)
[facility] Emergency Department Provider Note ____________________________________________  Time seen: 1143  I have reviewed the triage vital signs and the nursing notes.  HISTORY  Chief Complaint  Vaginal Bleeding  HPI Darlene Kelley is a 23 y.o. female presents also to the ED for evaluation of a 2-1/2-week complaint of intermittent vaginal bleeding.  Patient describes dark vaginal bleeding with small clots, over the last 2 and half weeks.  She describes symptoms are enough to warrant changing a tampon about every 3 hours.  She had her Nexplanon put in in 2017, by Dr. Letta PateAycock.  She denies any previous history of abnormal vaginal bleeding, menorrhagia, menorrhagia.  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  She denies any urinary frequency or urgency at this time patient also denies any nausea, vomiting, dizziness, abdominal pain, chest pain, shortness of breath.  Past Medical History:  Diagnosis Date  . GERD (gastroesophageal reflux disease)     Patient Active Problem List   Diagnosis Date Noted  . Encounter for supervision of other normal pregnancy 05/28/2016  . PROM (premature rupture of membranes) 05/28/2016    Past Surgical History:  Procedure Laterality Date  . WISDOM TOOTH EXTRACTION      Prior to Admission medications   Medication Sig Start Date End Date Taking? Authorizing Provider  cephALEXin (KEFLEX) 500 MG capsule Take 1 capsule (500 mg total) by mouth 3 (three) times daily. 11/02/18  Yes Fisher, Roselyn BeringSusan W, PA-C  HYDROcodone-acetaminophen (NORCO/VICODIN) 5-325 MG tablet Take 1 tablet by mouth every 6 (six) hours as needed for moderate pain. 11/02/18  Yes Fisher, Roselyn BeringSusan W, PA-C  sulfamethoxazole-trimethoprim (BACTRIM DS) 800-160 MG tablet Take 1 tablet by mouth 2 (two) times daily. 11/02/18  Yes Fisher, Roselyn BeringSusan W, PA-C  clindamycin (CLEOCIN) 100 MG vaginal suppository Place 1 suppository (100 mg total) vaginally at bedtime for 7  days. 11/07/18 11/14/18  Falecia Vannatter, Charlesetta IvoryJenise V Bacon, PA-C  doxycycline (VIBRA-TABS) 100 MG tablet Take 1 tablet (100 mg total) by mouth 2 (two) times daily. 11/07/18   Justo Hengel, Charlesetta IvoryJenise V Bacon, PA-C    Allergies Patient has no known allergies.  No family history on file.  Social History Social History   Tobacco Use  . Smoking status: Current Every Day Smoker    Packs/day: 0.25    Years: 4.00    Pack years: 1.00    Types: Cigarettes  . Smokeless tobacco: Never Used  Substance Use Topics  . Alcohol use: No    Comment: Occasional  . Drug use: No    Review of Systems  Constitutional: Negative for fever. Eyes: Negative for visual changes. ENT: Negative for sore throat. Cardiovascular: Negative for chest pain. Respiratory: Negative for shortness of breath. Gastrointestinal: Negative for abdominal pain, vomiting and diarrhea. Genitourinary: Negative for dysuria. Vaginal irritation and vaginal bleeding.  Musculoskeletal: Negative for back pain. Skin: Negative for rash. Neurological: Negative for headaches, focal weakness or numbness. ____________________________________________  PHYSICAL EXAM:  VITAL SIGNS: ED Triage Vitals  Enc Vitals Group     BP 11/07/18 1137 114/76     Pulse Rate 11/07/18 1137 95     Resp 11/07/18 1137 18     Temp 11/07/18 1137 98.2 F (36.8 C)     Temp Source 11/07/18 1137 Oral     SpO2 11/07/18 1137 100 %     Weight 11/07/18 1138 114 lb (51.7 kg)     Height 11/07/18 1138 5\' 6"  (1.676 m)     Head Circumference --  Peak Flow --      Pain Score 11/07/18 1137 7     Pain Loc --      Pain Edu? --      Excl. in [place]? --     Constitutional: Alert and oriented. Well appearing and in no distress. Head: Normocephalic and atraumatic. Eyes: Conjunctivae are normal. Normal extraocular movements Cardiovascular: Normal rate, regular rhythm. Normal distal pulses. Respiratory: Normal respiratory effort. No wheezes/rales/rhonchi. GU: normal external genitalia.  Dark blood and mucous from the closed cervical os. No adnexa tenderness or CMT.  Musculoskeletal: Nontender with normal range of motion in all extremities.  Neurologic:  Normal gait without ataxia. Normal speech and language. No gross focal neurologic deficits are appreciated. Skin:  Skin is warm, dry and intact. No rash noted. Psychiatric: Mood and affect are normal. Patient exhibits appropriate insight and judgment. ____________________________________________   LABS (pertinent positives/negatives) Labs Reviewed  WET PREP, GENITAL - Abnormal; Notable for the following components:      Result Value   Clue Cells Wet Prep HPF POC PRESENT (*)    WBC, Wet Prep HPF POC FEW (*)    All other components within normal limits  COMPREHENSIVE METABOLIC PANEL - Abnormal; Notable for the following components:   AST 13 (*)    All other components within normal limits  URINALYSIS, COMPLETE (UACMP) WITH MICROSCOPIC - Abnormal; Notable for the following components:   Color, Urine YELLOW (*)    APPearance HAZY (*)    Hgb urine dipstick LARGE (*)    Leukocytes,Ua TRACE (*)    All other components within normal limits  CHLAMYDIA/NGC RT PCR ([facility] ONLY)  CBC WITH DIFFERENTIAL/PLATELET  POC URINE PREG, ED  ____________________________________________  PROCEDURES  Procedures ____________________________________________  INITIAL IMPRESSION / ASSESSMENT AND PLAN / ED COURSE  [name] was evaluated in Emergency Department on 11/07/2018 for the symptoms described in the history of present illness. She was evaluated in the context of the global COVID-19 pandemic, which necessitated consideration that the patient might be at risk for infection with the SARS-CoV-2 virus that causes COVID-19. Institutional protocols and algorithms that pertain to the evaluation of patients at risk for COVID-19 are in a state of rapid change based on information released by regulatory bodies including the CDC and  federal and state organizations. These policies and algorithms were followed during the patient's care in the ED.  Patient with ED evaluation of a 2 and half week complaint of some abdominal uterine bleeding, despite persistent hormone therapy with Implanon.  Patient reports this the first time she has had an episode of normal bleeding.  She denies abdominal pain, pelvic pain, or urinary symptoms.  She does report some vaginal irritation.  She is confirmed to have BV on a wet prep, and otherwise is without any signs of acute infection, anemia, or other concerning pelvic findings.  Patient will be discharged with a prescription of Cleocin gel for the BV, and a trial of Doxy twice daily for 10 days, for management of her abnormal vaginal bleeding.  She will follow-up with her primary provider or return to the ED as needed. ____________________________________________  FINAL CLINICAL IMPRESSION(S) / ED DIAGNOSES  Final diagnoses:  Abnormal uterine bleeding (AUB)  BV (bacterial vaginosis)      Karmen Stabs, Charlesetta Ivory, PA-C 11/07/18 1406    Arnaldo Natal, MD 11/07/18 1630

## 2018-12-01 ENCOUNTER — Emergency Department
Admission: EM | Admit: 2018-12-01 | Discharge: 2018-12-01 | Disposition: A | Discharge status: Home / Self Care | Payer: Medicaid Other | Attending: Emergency Medicine | Admitting: Emergency Medicine

## 2018-12-01 ENCOUNTER — Encounter: Payer: Self-pay | Admitting: Emergency Medicine

## 2018-12-01 ENCOUNTER — Emergency Department: Payer: Medicaid Other

## 2018-12-01 ENCOUNTER — Other Ambulatory Visit: Payer: Self-pay

## 2018-12-01 DIAGNOSIS — F17210 Nicotine dependence, cigarettes, uncomplicated: Secondary | ICD-10-CM | POA: Diagnosis not present

## 2018-12-01 DIAGNOSIS — B349 Viral infection, unspecified: Secondary | ICD-10-CM | POA: Insufficient documentation

## 2018-12-01 DIAGNOSIS — Z20828 Contact with and (suspected) exposure to other viral communicable diseases: Secondary | ICD-10-CM | POA: Diagnosis not present

## 2018-12-01 DIAGNOSIS — R070 Pain in throat: Secondary | ICD-10-CM | POA: Insufficient documentation

## 2018-12-01 DIAGNOSIS — R05 Cough: Secondary | ICD-10-CM | POA: Diagnosis present

## 2018-12-01 MED ORDER — HYDROCOD POLST-CPM POLST ER 10-8 MG/5ML PO SUER: 5.0000 mL | Freq: Two times a day (BID) | ORAL | 0 refills | Status: DC

## 2018-12-01 NOTE — ED Triage Notes (Signed)
Pt to ED via POV c/o cough and sore throat. Pt is in NAD.

## 2018-12-01 NOTE — ED Provider Notes (Signed)
[facility] Emergency Department Provider Note       Time seen: ----------------------------------------- 10:18 AM on 12/01/2018 -----------------------------------------   I have reviewed the triage vital signs and the nursing notes.  HISTORY   Chief Complaint Cough and Sore Throat    HPI Darlene Kelley is a 23 y.o. female with a history of GERD who presents to the ED for cough and sore throat.  Patient arrives in no acute distress with no complaints of pain at this time or fever.  She has not had any known coronavirus contacts.  Past Medical History:  Diagnosis Date  . GERD (gastroesophageal reflux disease)     Patient Active Problem List   Diagnosis Date Noted  . Encounter for supervision of other normal pregnancy 05/28/2016  . PROM (premature rupture of membranes) 05/28/2016    Past Surgical History:  Procedure Laterality Date  . WISDOM TOOTH EXTRACTION      Allergies Patient has no known allergies.  Social History Social History   Tobacco Use  . Smoking status: Current Every Day Smoker    Packs/day: 0.25    Years: 4.00    Pack years: 1.00    Types: Cigarettes  . Smokeless tobacco: Never Used  Substance Use Topics  . Alcohol use: No    Comment: Occasional  . Drug use: No   Review of Systems Constitutional: Negative for fever. HEENT: Positive for sore throat Cardiovascular: Negative for chest pain. Respiratory: Negative for shortness of breath.  Positive for cough Gastrointestinal: Negative for abdominal pain, vomiting and diarrhea. Musculoskeletal: Negative for back pain. Skin: Negative for rash. Neurological: Negative for headaches, focal weakness or numbness.  All systems negative/normal/unremarkable except as stated in the HPI  ____________________________________________   PHYSICAL EXAM:  VITAL SIGNS: ED Triage Vitals  Enc Vitals Group     BP 12/01/18 1009 122/72     Pulse Rate 12/01/18 1009 80     Resp  12/01/18 1009 16     Temp 12/01/18 1009 98.1 F (36.7 C)     Temp Source 12/01/18 1009 Oral     SpO2 12/01/18 1009 100 %     Weight --      Height --      Head Circumference --      Peak Flow --      Pain Score 12/01/18 1005 0     Pain Loc --      Pain Edu? --      Excl. in [place]? --    Constitutional: Alert and oriented. Well appearing and in no distress. Eyes: Conjunctivae are normal. Normal extraocular movements. ENT      Head: Normocephalic and atraumatic.      Nose: No congestion/rhinnorhea.      Mouth/Throat: Mucous membranes are moist.      Neck: No stridor. Cardiovascular: Normal rate, regular rhythm. No murmurs, rubs, or gallops. Respiratory: Normal respiratory effort without tachypnea nor retractions. Breath sounds are clear and equal bilaterally. No wheezes/rales/rhonchi. Gastrointestinal: Soft and nontender. Normal bowel sounds Musculoskeletal: Nontender with normal range of motion in extremities. No lower extremity tenderness nor edema. Neurologic:  Normal speech and language. No gross focal neurologic deficits are appreciated.  Skin:  Skin is warm, dry and intact. No rash noted. Psychiatric: Mood and affect are normal. Speech and behavior are normal.  ___________________________________________  ED COURSE:  As part of my medical decision making, I reviewed the following data within the electronic MEDICAL RECORD NUMBER History obtained from family if available,  nursing notes, old chart and ekg, as well as notes from prior ED visits. Patient presented for viral symptoms, we will assess with labs and imaging as indicated at this time.   Procedures  Darlene Kelley was evaluated in Emergency Department on 12/01/2018 for the symptoms described in the history of present illness. She was evaluated in the context of the global COVID-19 pandemic, which necessitated consideration that the patient might be at risk for infection with the SARS-CoV-2 virus that causes COVID-19. Institutional  protocols and algorithms that pertain to the evaluation of patients at risk for COVID-19 are in a state of rapid change based on information released by regulatory bodies including the CDC and federal and state organizations. These policies and algorithms were followed during the patient's care in the ED.  ____________________________________________   LABS (pertinent positives/negatives)  Labs Reviewed  NOVEL CORONAVIRUS, NAA (HOSPITAL ORDER, SEND-OUT TO REF LAB)    RADIOLOGY  Chest x-ray is unremarkable  ____________________________________________   DIFFERENTIAL DIAGNOSIS   Viral URI, pharyngitis, coronavirus  FINAL ASSESSMENT AND PLAN  Viral infection   Plan: The patient had presented for viral symptoms. Patient's labs are still pending, she had a send out test for coronavirus. Patient's imaging did not reveal any acute process.  She is cleared for outpatient follow-up.   Laurence Aly, MD    Note: This note was generated in part or whole with voice recognition software. Voice recognition is usually quite accurate but there are transcription errors that can and very often do occur. I apologize for any typographical errors that were not detected and corrected.     Earleen Newport, MD 12/01/18 1020

## 2018-12-02 LAB — NOVEL CORONAVIRUS, NAA (HOSP ORDER, SEND-OUT TO REF LAB; TAT 18-24 HRS): SARS-CoV-2, NAA: NOT DETECTED

## 2019-01-06 ENCOUNTER — Emergency Department
Admission: EM | Admit: 2019-01-06 | Discharge: 2019-01-06 | Disposition: A | Discharge status: Home / Self Care | Payer: Medicaid Other | Attending: Emergency Medicine | Admitting: Emergency Medicine

## 2019-01-06 ENCOUNTER — Encounter: Payer: Self-pay | Admitting: Emergency Medicine

## 2019-01-06 ENCOUNTER — Other Ambulatory Visit: Payer: Self-pay

## 2019-01-06 DIAGNOSIS — J029 Acute pharyngitis, unspecified: Secondary | ICD-10-CM

## 2019-01-06 DIAGNOSIS — N760 Acute vaginitis: Secondary | ICD-10-CM | POA: Insufficient documentation

## 2019-01-06 DIAGNOSIS — B349 Viral infection, unspecified: Secondary | ICD-10-CM | POA: Insufficient documentation

## 2019-01-06 DIAGNOSIS — B9689 Other specified bacterial agents as the cause of diseases classified elsewhere: Secondary | ICD-10-CM

## 2019-01-06 DIAGNOSIS — F17210 Nicotine dependence, cigarettes, uncomplicated: Secondary | ICD-10-CM | POA: Diagnosis not present

## 2019-01-06 MED ORDER — METRONIDAZOLE 0.75 % VA GEL: 1.0000 | Freq: Two times a day (BID) | VAGINAL | 0 refills | Status: AC

## 2019-01-06 MED ORDER — MAGIC MOUTHWASH W/LIDOCAINE: 5.0000 mL | Freq: Four times a day (QID) | ORAL | 0 refills | Status: DC

## 2019-01-06 NOTE — ED Triage Notes (Signed)
Pt states she needs work note. States she has been out because of sore throat and BV. PT states she can not go back without being medically cleared. NAD noted . Denies fever or cough

## 2019-01-06 NOTE — ED Provider Notes (Signed)
[facility] Emergency Department Provider Note  ____________________________________________  Time seen: Approximately 6:41 PM  I have reviewed the triage vital signs and the nursing notes.   HISTORY  Chief Complaint No chief complaint on file.    HPI Darlene Kelley is a 23 y.o. female who presents the emergency department complaining of both sore throat as well as probable BV.  Patient reports that she has had multiple bouts of viral illnesses over the past 6 months.  Patient reports that she is having sore throat with no nasal congestion, fevers or chills, headache, neck pain or stiffness, cough, shortness of breath.  Patient reports that she has been evaluated multiple times and always diagnosed with a viral illness.  Patient is requesting symptom control medication as well as a note for work as she is having to miss work given her symptoms.  Patient has no contact with a known COVID-19 patient.  Patient is also complaining of fishy smelling vaginal discharge.  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  Patient was following with OB/GYN until she lost her Medicaid.  Patient is requesting MetroGel for same.  She denies any vaginal bleeding, pelvic pain, dysuria, polyuria, hematuria.  She denies any chance of pregnancy.         Past Medical History:  Diagnosis Date  . GERD (gastroesophageal reflux disease)     Patient Active Problem List   Diagnosis Date Noted  . Encounter for supervision of other normal pregnancy 05/28/2016  . PROM (premature rupture of membranes) 05/28/2016    Past Surgical History:  Procedure Laterality Date  . WISDOM TOOTH EXTRACTION      Prior to Admission medications   Medication Sig Start Date End Date Taking? Authorizing Provider  magic mouthwash w/lidocaine SOLN Take 5 mLs by mouth 4 (four) times daily. 01/06/19   , Delorise RoyalsJonathan D, PA-C  metroNIDAZOLE (METROGEL VAGINAL) 0.75 %  vaginal gel Place 1 Applicatorful vaginally 2 (two) times daily for 7 days. 01/06/19 01/13/19  , Delorise RoyalsJonathan D, PA-C    Allergies Patient has no known allergies.  No family history on file.  Social History Social History   Tobacco Use  . Smoking status: Current Every Day Smoker    Packs/day: 0.25    Years: 4.00    Pack years: 1.00    Types: Cigarettes  . Smokeless tobacco: Never Used  Substance Use Topics  . Alcohol use: No    Comment: Occasional  . Drug use: No     Review of Systems  Constitutional: No fever/chills Eyes: No visual changes. No discharge ENT: Positive for sore throat Cardiovascular: no chest pain. Respiratory: no cough. No SOB. Gastrointestinal: No abdominal pain.  No nausea, no vomiting.  No diarrhea.  No constipation. Genitourinary: Negative for dysuria. No hematuria.  Positive for vaginal discharge Musculoskeletal: Negative for musculoskeletal pain. Skin: Negative for rash, abrasions, lacerations, ecchymosis. Neurological: Negative for headaches, focal weakness or numbness. 10-point ROS otherwise negative.  ____________________________________________   PHYSICAL EXAM:  VITAL SIGNS: ED Triage Vitals  Enc Vitals Group     BP 01/06/19 1736 109/65     Pulse Rate 01/06/19 1736 100     Resp 01/06/19 1736 16     Temp 01/06/19 1736 98.8 F (37.1 C)     Temp Source 01/06/19 1736 Oral     SpO2 01/06/19 1736 100 %     Weight 01/06/19 1737 113 lb (51.3 kg)  Height 01/06/19 1737 5\' 6"  (1.676 m)     Head Circumference --      Peak Flow --      Pain Score 01/06/19 1734 0     Pain Loc --      Pain Edu? --      Excl. in GC? --      Constitutional: Alert and oriented. Well appearing and in no acute distress. Eyes: Conjunctivae are normal. PERRL. EOMI. Head: Atraumatic. ENT:      Ears:       Nose: No congestion/rhinnorhea.      Mouth/Throat: Mucous membranes are moist.  Throat is minimally erythematous.  No edema.  Tonsils are mildly  erythematous but nonedematous no exudates.  Uvula is midline. Neck: No stridor.  Neck is supple full range of motion Hematological/Lymphatic/Immunilogical: No cervical lymphadenopathy. Cardiovascular: Normal rate, regular rhythm. Normal S1 and S2.  Good peripheral circulation. Respiratory: Normal respiratory effort without tachypnea or retractions. Lungs CTAB. Good air entry to the bases with no decreased or absent breath sounds. Gastrointestinal: Bowel sounds 4 quadrants. Soft and nontender to palpation. No guarding or rigidity. No palpable masses. No distention. No CVA tenderness. Genitourinary: Patient declines pelvic exam Musculoskeletal: Full range of motion to all extremities. No gross deformities appreciated. Neurologic:  Normal speech and language. No gross focal neurologic deficits are appreciated.  Skin:  Skin is warm, dry and intact. No rash noted. Psychiatric: Mood and affect are normal. Speech and behavior are normal. Patient exhibits appropriate insight and judgement.   ____________________________________________   LABS (all labs ordered are listed, but only abnormal results are displayed)  Labs Reviewed - No data to display ____________________________________________  EKG   ____________________________________________  RADIOLOGY   No results found.  ____________________________________________    PROCEDURES  Procedure(s) performed:    Procedures    Medications - No data to display   ____________________________________________   INITIAL IMPRESSION / ASSESSMENT AND PLAN / ED COURSE  Pertinent labs & imaging results that were available during my care of the patient were reviewed by me and considered in my medical decision making (see chart for details).  Review of the [place] CSRS was performed in accordance of the [facility] prior to dispensing any controlled drugs.        The patient was evaluated for the symptoms described in the history of present  illness. The patient was evaluated in the context of the global COVID-19 pandemic, which necessitated consideration that the patient might be at risk for infection with the SARS-CoV-2 virus that causes COVID-19. Institutional protocols and algorithms that pertain to the evaluation of patients at risk for COVID-19 are in a state of rapid change based on information released by regulatory bodies including the CDC and federal and state organizations. The most current policies and algorithms were followed during the patient's care in the ED.   Patient's diagnosis is consistent with viral pharyngitis, BV.  Patient presented to the emergency department with complaints of sore throat as well as probable BV.  Patient has had multiple episodes of viral illnesses over the past 6 months.  Patient reports that they believe this is secondary to her thyroid.  Patient is currently undergoing evaluation of her thyroid.  She is unsure whether she has hypothyroidism or hyperthyroidism.  No fevers or chills, nasal congestion, coughing.  Differential included viral pharyngitis, strep pharyngitis, mono, COVID-19.  Based on physical exam, patient's repeat history, symptoms are most consistent with viral pharyngitis.  Patient will be given Magic mouthwash for  symptom relief as well as a note for work.  Patient is experiencing symptoms consistent with BV.  She declines a pelvic exam at this time stating "I had this enough to know what it is."  At this time patient is requiring MetroGel which I will prescribe.  Follow-up with primary care for further assessment of her thyroid, follow-up with OB/GYN for chronic BV.Marland Kitchen  Patient is given ED precautions to return to the ED for any worsening or new symptoms.     ____________________________________________  FINAL CLINICAL IMPRESSION(S) / ED DIAGNOSES  Final diagnoses:  Viral pharyngitis  BV (bacterial vaginosis)      NEW MEDICATIONS STARTED DURING THIS VISIT:  ED Discharge  Orders         Ordered    metroNIDAZOLE (METROGEL VAGINAL) 0.75 % vaginal gel  2 times daily     01/06/19 1859    magic mouthwash w/lidocaine SOLN  4 times daily    Note to Pharmacy: Dispense in a 1/1/1 ratio. Use lidocaine, diphenhydramine, prednisolone   01/06/19 1859              This chart was dictated using voice recognition software/Dragon. Despite best efforts to proofread, errors can occur which can change the meaning. Any change was purely unintentional.    Darletta Moll, PA-C 01/06/19 1900    Nance Pear, MD 01/06/19 1950

## 2019-01-06 NOTE — ED Notes (Signed)
See triage note  States she has a sore throat for a few days  And has been out of work for sore throat  Also thinks she has BV  States she was not treated recently but feels like she has it again  Also needs work note

## 2019-01-13 ENCOUNTER — Other Ambulatory Visit: Payer: Self-pay

## 2019-01-13 ENCOUNTER — Emergency Department
Admission: EM | Admit: 2019-01-13 | Discharge: 2019-01-13 | Disposition: A | Discharge status: Home / Self Care | Payer: Medicaid Other | Attending: Emergency Medicine | Admitting: Emergency Medicine

## 2019-01-13 DIAGNOSIS — Z20828 Contact with and (suspected) exposure to other viral communicable diseases: Secondary | ICD-10-CM | POA: Insufficient documentation

## 2019-01-13 DIAGNOSIS — J029 Acute pharyngitis, unspecified: Secondary | ICD-10-CM | POA: Diagnosis not present

## 2019-01-13 DIAGNOSIS — Z79899 Other long term (current) drug therapy: Secondary | ICD-10-CM | POA: Diagnosis not present

## 2019-01-13 DIAGNOSIS — F17210 Nicotine dependence, cigarettes, uncomplicated: Secondary | ICD-10-CM | POA: Diagnosis not present

## 2019-01-13 LAB — TSH: TSH: 0.955 u[IU]/mL (ref 0.350–4.500)

## 2019-01-13 LAB — URINALYSIS, COMPLETE (UACMP) WITH MICROSCOPIC
Bacteria, UA: NONE SEEN
Bilirubin Urine: NEGATIVE
Glucose, UA: NEGATIVE mg/dL
Ketones, ur: NEGATIVE mg/dL
Nitrite: NEGATIVE
Protein, ur: NEGATIVE mg/dL
Specific Gravity, Urine: 1.027 (ref 1.005–1.030)
pH: 5 (ref 5.0–8.0)

## 2019-01-13 LAB — COMPREHENSIVE METABOLIC PANEL
ALT: 12 U/L (ref 0–44)
AST: 11 U/L — ABNORMAL LOW (ref 15–41)
Albumin: 4.2 g/dL (ref 3.5–5.0)
Alkaline Phosphatase: 57 U/L (ref 38–126)
Anion gap: 7 (ref 5–15)
BUN: 17 mg/dL (ref 6–20)
CO2: 25 mmol/L (ref 22–32)
Calcium: 9.5 mg/dL (ref 8.9–10.3)
Chloride: 107 mmol/L (ref 98–111)
Creatinine, Ser: 0.81 mg/dL (ref 0.44–1.00)
GFR calc Af Amer: >60 mL/min (ref >60)
GFR calc non Af Amer: >60 mL/min (ref >60)
Glucose, Bld: 91 mg/dL (ref 70–99)
Potassium: 3.6 mmol/L (ref 3.5–5.1)
Sodium: 139 mmol/L (ref 135–145)
Total Bilirubin: 1.4 mg/dL — ABNORMAL HIGH (ref 0.3–1.2)
Total Protein: 7.3 g/dL (ref 6.5–8.1)

## 2019-01-13 LAB — CBC
HCT: 39.2 % (ref 36.0–46.0)
Hemoglobin: 13.4 g/dL (ref 12.0–15.0)
MCH: 30.4 pg (ref 26.0–34.0)
MCHC: 34.2 g/dL (ref 30.0–36.0)
MCV: 88.9 fL (ref 80.0–100.0)
Platelets: 222 10*3/uL (ref 150–400)
RBC: 4.41 MIL/uL (ref 3.87–5.11)
RDW: 11.6 % (ref 11.5–15.5)
WBC: 7.8 10*3/uL (ref 4.0–10.5)
nRBC: 0 % (ref 0.0–0.2)

## 2019-01-13 LAB — POCT PREGNANCY, URINE: Preg Test, Ur: NEGATIVE

## 2019-01-13 NOTE — ED Provider Notes (Signed)
[facility] Emergency Department Provider Note  Time seen: 1:49 PM  I have reviewed the triage vital signs and the nursing notes.   HISTORY  Chief Complaint Sore Throat   HPI Darlene Kelley is a 23 y.o. female with a past medical history of gastric reflux who presents to the emergency department for sore throat.  According to the patient over the past 2 weeks she has had a sore throat and felt generalized fatigue/weakness.  Patient denies any cough or significant shortness of breath.  Denies any chest pain or abdominal pain, nausea vomiting or diarrhea.  Patient states a history of thyroid disorder however she does not have insurance and cannot see a doctor, states she is not taking any medications for her thyroid at this time.   Past Medical History:  Diagnosis Date  . GERD (gastroesophageal reflux disease)     Patient Active Problem List   Diagnosis Date Noted  . Encounter for supervision of other normal pregnancy 05/28/2016  . PROM (premature rupture of membranes) 05/28/2016    Past Surgical History:  Procedure Laterality Date  . WISDOM TOOTH EXTRACTION      Prior to Admission medications   Medication Sig Start Date End Date Taking? Authorizing Provider  magic mouthwash w/lidocaine SOLN Take 5 mLs by mouth 4 (four) times daily. 01/06/19   Cuthriell, Delorise RoyalsJonathan D, PA-C  metroNIDAZOLE (METROGEL VAGINAL) 0.75 % vaginal gel Place 1 Applicatorful vaginally 2 (two) times daily for 7 days. 01/06/19 01/13/19  Cuthriell, Delorise RoyalsJonathan D, PA-C    No Known Allergies  No family history on file.  Social History Social History   Tobacco Use  . Smoking status: Current Every Day Smoker    Packs/day: 0.25    Years: 4.00    Pack years: 1.00    Types: Cigarettes  . Smokeless tobacco: Never Used  Substance Use Topics  . Alcohol use: No    Comment: Occasional  . Drug use: No    Review of Systems Constitutional: No known fever. ENT: Positive for sore  throat Cardiovascular: Negative for chest pain. Respiratory: Negative for shortness of breath.  Negative for cough Gastrointestinal: Negative for abdominal pain, vomiting and diarrhea. Musculoskeletal: Negative for musculoskeletal complaints Skin: Negative for skin complaints  Neurological: Negative for headache All other ROS negative  ____________________________________________   PHYSICAL EXAM:  VITAL SIGNS: ED Triage Vitals [01/13/19 1200]  Enc Vitals Group     BP (!) 116/97     Pulse Rate 89     Resp 16     Temp 98.8 F (37.1 C)     Temp Source Oral     SpO2 100 %     Weight 112 lb 14 oz (51.2 kg)     Height 5\' 6"  (1.676 m)     Head Circumference      Peak Flow      Pain Score 8     Pain Loc      Pain Edu?      Excl. in GC?    Constitutional: Alert and oriented. Well appearing and in no distress. Eyes: Normal exam ENT      Head: Normocephalic and atraumatic      Mouth/Throat: Mucous membranes are moist.  No significant tonsillar hypertrophy erythema or exudate. Cardiovascular: Normal rate, regular rhythm. No murmur Respiratory: Normal respiratory effort without tachypnea nor retractions. Breath sounds are clear Gastrointestinal: Soft and nontender. No distention.   Musculoskeletal: Nontender with normal range of motion in all extremities.  Neurologic:  Normal speech and language. No gross focal neurologic deficits  Skin:  Skin is warm, dry and intact.  Psychiatric: Mood and affect are normal.    INITIAL IMPRESSION / ASSESSMENT AND PLAN / ED COURSE  Pertinent labs & imaging results that were available during my care of the patient were reviewed by me and considered in my medical decision making (see chart for details).   Patient presents to the emergency department for continued sore throat.  Was seen in the emergency department last week and was discharged home with supportive care.  Patient continues to have a mild sore throat continues to have generalized  fatigue/weakness.  Overall the patient appears well currently, her work-up in the emergency department including lab work is largely within normal limits.  I have added on a TSH as a precaution.  No known fever, no cough.  We will swab for coronavirus via outpatient testing as a precaution.  Patient agreeable to plan of care.  Darlene Kelley was evaluated in Emergency Department on 01/13/2019 for the symptoms described in the history of present illness. She was evaluated in the context of the global COVID-19 pandemic, which necessitated consideration that the patient might be at risk for infection with the SARS-CoV-2 virus that causes COVID-19. Institutional protocols and algorithms that pertain to the evaluation of patients at risk for COVID-19 are in a state of rapid change based on information released by regulatory bodies including the CDC and federal and state organizations. These policies and algorithms were followed during the patient's care in the ED.  ____________________________________________   FINAL CLINICAL IMPRESSION(S) / ED DIAGNOSES  Pharyngitis Weakness   Harvest Dark, MD 01/13/19 1354

## 2019-01-13 NOTE — ED Triage Notes (Signed)
Says she is not better. Continues to have sore throat that is scratchy and sharp pains at times.  Says she doesn't have any energy.

## 2019-01-14 LAB — NOVEL CORONAVIRUS, NAA (HOSP ORDER, SEND-OUT TO REF LAB; TAT 18-24 HRS): SARS-CoV-2, NAA: NOT DETECTED

## 2019-06-24 ENCOUNTER — Emergency Department: Payer: Medicaid Other

## 2019-06-24 ENCOUNTER — Other Ambulatory Visit: Payer: Self-pay

## 2019-06-24 ENCOUNTER — Encounter: Payer: Self-pay | Admitting: *Deleted

## 2019-06-24 DIAGNOSIS — U071 COVID-19: Secondary | ICD-10-CM | POA: Diagnosis not present

## 2019-06-24 DIAGNOSIS — F17210 Nicotine dependence, cigarettes, uncomplicated: Secondary | ICD-10-CM | POA: Insufficient documentation

## 2019-06-24 DIAGNOSIS — R509 Fever, unspecified: Secondary | ICD-10-CM | POA: Diagnosis present

## 2019-06-24 LAB — CBC WITH DIFFERENTIAL/PLATELET
Abs Immature Granulocytes: 0.02 10*3/uL (ref 0.00–0.07)
Basophils Absolute: 0 10*3/uL (ref 0.0–0.1)
Basophils Relative: 0 %
Eosinophils Absolute: 0.1 10*3/uL (ref 0.0–0.5)
Eosinophils Relative: 1 %
HCT: 36.7 % (ref 36.0–46.0)
Hemoglobin: 12.8 g/dL (ref 12.0–15.0)
Immature Granulocytes: 0 %
Lymphocytes Relative: 9 %
Lymphs Abs: 0.5 10*3/uL — ABNORMAL LOW (ref 0.7–4.0)
MCH: 30.5 pg (ref 26.0–34.0)
MCHC: 34.9 g/dL (ref 30.0–36.0)
MCV: 87.4 fL (ref 80.0–100.0)
Monocytes Absolute: 0.7 10*3/uL (ref 0.1–1.0)
Monocytes Relative: 12 %
Neutro Abs: 4.7 10*3/uL (ref 1.7–7.7)
Neutrophils Relative %: 78 %
Platelets: 177 10*3/uL (ref 150–400)
RBC: 4.2 MIL/uL (ref 3.87–5.11)
RDW: 11.7 % (ref 11.5–15.5)
WBC: 6.1 10*3/uL (ref 4.0–10.5)
nRBC: 0 % (ref 0.0–0.2)

## 2019-06-24 LAB — COMPREHENSIVE METABOLIC PANEL
ALT: 12 U/L (ref 0–44)
AST: 11 U/L — ABNORMAL LOW (ref 15–41)
Albumin: 4.3 g/dL (ref 3.5–5.0)
Alkaline Phosphatase: 57 U/L (ref 38–126)
Anion gap: 7 (ref 5–15)
BUN: 11 mg/dL (ref 6–20)
CO2: 25 mmol/L (ref 22–32)
Calcium: 9.4 mg/dL (ref 8.9–10.3)
Chloride: 102 mmol/L (ref 98–111)
Creatinine, Ser: 0.78 mg/dL (ref 0.44–1.00)
GFR calc Af Amer: >60 mL/min (ref >60)
GFR calc non Af Amer: >60 mL/min (ref >60)
Glucose, Bld: 102 mg/dL — ABNORMAL HIGH (ref 70–99)
Potassium: 3.6 mmol/L (ref 3.5–5.1)
Sodium: 134 mmol/L — ABNORMAL LOW (ref 135–145)
Total Bilirubin: 1 mg/dL (ref 0.3–1.2)
Total Protein: 7.6 g/dL (ref 6.5–8.1)

## 2019-06-24 LAB — URINALYSIS, COMPLETE (UACMP) WITH MICROSCOPIC
Bacteria, UA: NONE SEEN
Bilirubin Urine: NEGATIVE
Glucose, UA: NEGATIVE mg/dL
Ketones, ur: NEGATIVE mg/dL
Leukocytes,Ua: NEGATIVE
Nitrite: NEGATIVE
Protein, ur: NEGATIVE mg/dL
Specific Gravity, Urine: 1.017 (ref 1.005–1.030)
WBC, UA: NONE SEEN WBC/hpf (ref 0–5)
pH: 8 (ref 5.0–8.0)

## 2019-06-24 LAB — LACTIC ACID, PLASMA: Lactic Acid, Venous: 0.9 mmol/L (ref 0.5–1.9)

## 2019-06-24 LAB — POCT PREGNANCY, URINE: Preg Test, Ur: NEGATIVE

## 2019-06-24 MED ORDER — ACETAMINOPHEN 325 MG PO TABS
650.0000 mg | ORAL_TABLET | Freq: Once | ORAL | Status: AC
  Administered 2019-06-24: 22:00:00 650 mg via ORAL
  Filled 2019-06-24: qty 2

## 2019-06-24 MED ORDER — SODIUM CHLORIDE 0.9% FLUSH: 3.0000 mL | Freq: Once | INTRAVENOUS | Status: DC

## 2019-06-24 NOTE — ED Triage Notes (Signed)
Pt c/o body aches, fever, headache,sore throat, loss of taste (onset today).  Tmax 103.0. Denies any shortness of breath. Did not take any medication for her fever.  A worker at her job tested positive for [place].

## 2019-06-25 ENCOUNTER — Emergency Department
Admission: EM | Admit: 2019-06-25 | Discharge: 2019-06-25 | Disposition: A | Discharge status: Home / Self Care | Payer: Medicaid Other | Attending: Emergency Medicine | Admitting: Emergency Medicine

## 2019-06-25 DIAGNOSIS — U071 COVID-19: Secondary | ICD-10-CM

## 2019-06-25 LAB — POC SARS CORONAVIRUS 2 AG: SARS Coronavirus 2 Ag: POSITIVE — AB

## 2019-06-25 MED ORDER — AZITHROMYCIN 500 MG PO TABS
500.0000 mg | ORAL_TABLET | Freq: Once | ORAL | Status: AC
  Administered 2019-06-25: 03:00:00 500 mg via ORAL
  Filled 2019-06-25: qty 1

## 2019-06-25 MED ORDER — IBUPROFEN 600 MG PO TABS
600.0000 mg | ORAL_TABLET | Freq: Once | ORAL | Status: AC
  Administered 2019-06-25: 600 mg via ORAL
  Filled 2019-06-25: qty 1

## 2019-06-25 MED ORDER — AZITHROMYCIN 500 MG PO TABS: 500.0000 mg | ORAL_TABLET | Freq: Every day | ORAL | 0 refills | Status: AC

## 2019-06-25 MED ORDER — PREDNISONE 20 MG PO TABS: 60.0000 mg | ORAL_TABLET | Freq: Every day | ORAL | 0 refills | Status: AC

## 2019-06-25 MED ORDER — PREDNISONE 20 MG PO TABS
60.0000 mg | ORAL_TABLET | Freq: Once | ORAL | Status: AC
  Administered 2019-06-25: 03:00:00 60 mg via ORAL
  Filled 2019-06-25: qty 3

## 2019-06-25 NOTE — ED Provider Notes (Addendum)
[facility] Emergency Department Provider Note    First MD Initiated Contact with Patient 06/25/19 [phone]     (approximate)  I have reviewed the triage vital signs and the nursing notes.   HISTORY  Chief Complaint   HPI Darlene Kelley is a 23 y.o. female with below list of previous medical conditions presents to the emergency department secondary to 1 day history of fever with T-max of 103 sore throat generalized body aches loss of taste.  Patient states that coworker of hers had Covid and returned to work recently.        Past Medical History:  Diagnosis Date  . GERD (gastroesophageal reflux disease)     Patient Active Problem List   Diagnosis Date Noted  . Encounter for supervision of other normal pregnancy 05/28/2016  . PROM (premature rupture of membranes) 05/28/2016    Past Surgical History:  Procedure Laterality Date  . WISDOM TOOTH EXTRACTION      Prior to Admission medications   Medication Sig Start Date End Date Taking? Authorizing Provider  magic mouthwash w/lidocaine SOLN Take 5 mLs by mouth 4 (four) times daily. 01/06/19   Cuthriell, Charline Bills, PA-C    Allergies Patient has no known allergies.  No family history on file.  Social History Social History   Tobacco Use  . Smoking status: Current Every Day Smoker    Packs/day: 0.25    Years: 4.00    Pack years: 1.00    Types: Cigarettes  . Smokeless tobacco: Never Used  Substance Use Topics  . Alcohol use: No    Comment: Occasional  . Drug use: No    Review of Systems Constitutional: No fever/chills Eyes: No visual changes. ENT: No sore throat. Cardiovascular: Denies chest pain. Respiratory: Denies shortness of breath. Gastrointestinal: No abdominal pain.  No nausea, no vomiting.  No diarrhea.  No constipation. Genitourinary: Negative for dysuria. Musculoskeletal: Negative for neck pain.  Negative for back pain. Integumentary: Negative for rash. Neurological:  Negative for headaches, focal weakness or numbness.  ____________________________________________   PHYSICAL EXAM:  VITAL SIGNS: ED Triage Vitals  Enc Vitals Group     BP 06/24/19 2206 116/87     Pulse Rate 06/24/19 2206 (!) 138     Resp 06/24/19 2206 20     Temp 06/24/19 2206 (!) 103 F (39.4 C)     Temp Source 06/25/19 0157 Oral     SpO2 06/24/19 2206 100 %     Weight --      Height --      Head Circumference --      Peak Flow --      Pain Score --      Pain Loc --      Pain Edu? --      Excl. in [place]? --     Constitutional: Alert and oriented.  Eyes: Conjunctivae are normal.  Mouth/Throat: Patient is wearing a mask. Neck: No stridor.  No meningeal signs.   Cardiovascular: Normal rate, regular rhythm. Good peripheral circulation. Grossly normal heart sounds. Respiratory: Normal respiratory effort.  No retractions. Gastrointestinal: Soft and nontender. No distention.  Musculoskeletal: No lower extremity tenderness nor edema. No gross deformities of extremities. Neurologic:  Normal speech and language. No gross focal neurologic deficits are appreciated.  Skin:  Skin is warm, dry and intact. Psychiatric: Mood and affect are normal. Speech and behavior are normal.  ____________________________________________   LABS (all labs ordered are listed, but only abnormal results are  displayed)  Labs Reviewed  COMPREHENSIVE METABOLIC PANEL - Abnormal; Notable for the following components:      Result Value   Sodium 134 (*)    Glucose, Bld 102 (*)    AST 11 (*)    All other components within normal limits  CBC WITH DIFFERENTIAL/PLATELET - Abnormal; Notable for the following components:   Lymphs Abs 0.5 (*)    All other components within normal limits  URINALYSIS, COMPLETE (UACMP) WITH MICROSCOPIC - Abnormal; Notable for the following components:   Color, Urine YELLOW (*)    APPearance CLEAR (*)    Hgb urine dipstick SMALL (*)    All other components within normal limits    SARS CORONAVIRUS 2 (TAT 6-24 HRS)  LACTIC ACID, PLASMA  POC URINE PREG, ED  POCT PREGNANCY, URINE  POC SARS CORONAVIRUS 2 AG -  ED     RADIOLOGY I, [place] Gains, personally viewed and evaluated these images (plain radiographs) as part of my medical decision making, as well as reviewing the written report by the radiologist.  ED MD interpretation: Negative chest x-ray per radiologist.  Official radiology report(s): DG Chest 2 View  Result Date: 06/24/2019 CLINICAL DATA:  Fever and body aches. EXAM: CHEST - 2 VIEW COMPARISON:  12/01/2018 FINDINGS: The cardiomediastinal contours are normal. The lungs are clear. Pulmonary vasculature is normal. No consolidation, pleural effusion, or pneumothorax. No acute osseous abnormalities are seen. IMPRESSION: Negative radiographs of the chest. Electronically Signed   By: Narda Rutherford M.D.   On: 06/24/2019 23:23      Procedures   ____________________________________________   INITIAL IMPRESSION / MDM / ASSESSMENT AND PLAN / ED COURSE  As part of my medical decision making, I reviewed the following data within the electronic MEDICAL RECORD NUMBER  23 year old female presented with above-stated history and physical exam concerning for COVID-19 infection which was confirmed in the emergency department.  Patient oxygen saturation 99% on room air without any increased work of breathing.  Spoke with the patient at length regarding warning signs that would warrant immediate return to the emergency department. ____________________________________________  FINAL CLINICAL IMPRESSION(S) / ED DIAGNOSES  Final diagnoses:  COVID-19 virus infection     MEDICATIONS GIVEN DURING THIS VISIT:  Medications  acetaminophen (TYLENOL) tablet 650 mg (650 mg Oral Given 06/24/19 2213)     ED Discharge Orders    None      *Please note:  Darlene Kelley was evaluated in Emergency Department on 06/25/2019 for the symptoms described in the history of present  illness. She was evaluated in the context of the global COVID-19 pandemic, which necessitated consideration that the patient might be at risk for infection with the SARS-CoV-2 virus that causes COVID-19. Institutional protocols and algorithms that pertain to the evaluation of patients at risk for COVID-19 are in a state of rapid change based on information released by regulatory bodies including the CDC and federal and state organizations. These policies and algorithms were followed during the patient's care in the ED.  Some ED evaluations and interventions may be delayed as a result of limited staffing during the pandemic.*  Note:  This document was prepared using Dragon voice recognition software and may include unintentional dictation errors.   Darci Current, MD 06/25/19 9417    Darci Current, MD 06/25/19 [phone]

## 2019-08-03 ENCOUNTER — Emergency Department
Admission: EM | Admit: 2019-08-03 | Discharge: 2019-08-03 | Disposition: A | Discharge status: Home / Self Care | Payer: Medicaid Other | Attending: Student | Admitting: Student

## 2019-08-03 ENCOUNTER — Other Ambulatory Visit: Payer: Self-pay

## 2019-08-03 ENCOUNTER — Encounter: Payer: Self-pay | Admitting: Emergency Medicine

## 2019-08-03 DIAGNOSIS — Z8616 Personal history of COVID-19: Secondary | ICD-10-CM | POA: Diagnosis not present

## 2019-08-03 DIAGNOSIS — R112 Nausea with vomiting, unspecified: Secondary | ICD-10-CM | POA: Diagnosis present

## 2019-08-03 DIAGNOSIS — F17210 Nicotine dependence, cigarettes, uncomplicated: Secondary | ICD-10-CM | POA: Diagnosis not present

## 2019-08-03 DIAGNOSIS — Z79899 Other long term (current) drug therapy: Secondary | ICD-10-CM | POA: Diagnosis not present

## 2019-08-03 LAB — URINALYSIS, COMPLETE (UACMP) WITH MICROSCOPIC
Bacteria, UA: NONE SEEN
Bilirubin Urine: NEGATIVE
Glucose, UA: NEGATIVE mg/dL
Ketones, ur: NEGATIVE mg/dL
Leukocytes,Ua: NEGATIVE
Nitrite: NEGATIVE
Protein, ur: NEGATIVE mg/dL
Specific Gravity, Urine: 1.031 — ABNORMAL HIGH (ref 1.005–1.030)
pH: 5 (ref 5.0–8.0)

## 2019-08-03 LAB — LIPASE, BLOOD: Lipase: 26 U/L (ref 11–51)

## 2019-08-03 LAB — COMPREHENSIVE METABOLIC PANEL
ALT: 11 U/L (ref 0–44)
AST: 14 U/L — ABNORMAL LOW (ref 15–41)
Albumin: 3.7 g/dL (ref 3.5–5.0)
Alkaline Phosphatase: 56 U/L (ref 38–126)
Anion gap: 6 (ref 5–15)
BUN: 15 mg/dL (ref 6–20)
CO2: 28 mmol/L (ref 22–32)
Calcium: 9.6 mg/dL (ref 8.9–10.3)
Chloride: 106 mmol/L (ref 98–111)
Creatinine, Ser: 0.75 mg/dL (ref 0.44–1.00)
GFR calc Af Amer: >60 mL/min (ref >60)
GFR calc non Af Amer: >60 mL/min (ref >60)
Glucose, Bld: 79 mg/dL (ref 70–99)
Potassium: 3.6 mmol/L (ref 3.5–5.1)
Sodium: 140 mmol/L (ref 135–145)
Total Bilirubin: 1.2 mg/dL (ref 0.3–1.2)
Total Protein: 6.8 g/dL (ref 6.5–8.1)

## 2019-08-03 LAB — GROUP A STREP BY PCR: Group A Strep by PCR: NOT DETECTED

## 2019-08-03 LAB — PREGNANCY, URINE: Preg Test, Ur: NEGATIVE

## 2019-08-03 LAB — CBC
HCT: 35.5 % — ABNORMAL LOW (ref 36.0–46.0)
Hemoglobin: 12.3 g/dL (ref 12.0–15.0)
MCH: 31.1 pg (ref 26.0–34.0)
MCHC: 34.6 g/dL (ref 30.0–36.0)
MCV: 89.6 fL (ref 80.0–100.0)
Platelets: 202 10*3/uL (ref 150–400)
RBC: 3.96 MIL/uL (ref 3.87–5.11)
RDW: 12.3 % (ref 11.5–15.5)
WBC: 10.7 10*3/uL — ABNORMAL HIGH (ref 4.0–10.5)
nRBC: 0 % (ref 0.0–0.2)

## 2019-08-03 MED ORDER — ONDANSETRON HCL 4 MG/2ML IJ SOLN
4.0000 mg | Freq: Once | INTRAMUSCULAR | Status: AC
  Administered 2019-08-03: 4 mg via INTRAVENOUS
  Filled 2019-08-03: qty 2

## 2019-08-03 MED ORDER — SODIUM CHLORIDE 0.9% FLUSH: 3.0000 mL | Freq: Once | INTRAVENOUS | Status: DC

## 2019-08-03 MED ORDER — ONDANSETRON 4 MG PO TBDP: 4.0000 mg | ORAL_TABLET | Freq: Three times a day (TID) | ORAL | 0 refills | Status: DC | PRN

## 2019-08-03 NOTE — ED Notes (Signed)
Po challenge

## 2019-08-03 NOTE — ED Notes (Signed)
Throat pain since she woke up this morning, denies fever/chill or any other concerns. Throat swabbed completed and sent.

## 2019-08-03 NOTE — Discharge Instructions (Addendum)
Follow-up with your regular doctor if not improving in 2 to 3 days.  Return emergency department worsening.  Take Zofran for nausea/vomiting as needed.  Tylenol or ibuprofen if you develop fever.

## 2019-08-03 NOTE — ED Provider Notes (Signed)
[facility] Emergency Department Provider Note  ____________________________________________   First MD Initiated Contact with Patient 08/03/19 1859     (approximate)  I have reviewed the triage vital signs and the nursing notes.   HISTORY  Chief Complaint Emesis and Sore Throat    HPI Darlene Kelley is a 24 y.o. female presents emergency department complaining of sore throat, nausea/vomiting since this morning.  No chest pain or shortness of breath.  Patient had Covid and December.  States her children have Covid-like symptoms but tested negative.  They are quarantining just in case.  Patient does have Nexplanon.  She denies diarrhea.  Remainder review of systems negative    Past Medical History:  Diagnosis Date  . GERD (gastroesophageal reflux disease)     Patient Active Problem List   Diagnosis Date Noted  . Encounter for supervision of other normal pregnancy 05/28/2016  . PROM (premature rupture of membranes) 05/28/2016    Past Surgical History:  Procedure Laterality Date  . WISDOM TOOTH EXTRACTION      Prior to Admission medications   Medication Sig Start Date End Date Taking? Authorizing Provider  magic mouthwash w/lidocaine SOLN Take 5 mLs by mouth 4 (four) times daily. 01/06/19   Cuthriell, Delorise Royals, PA-C  ondansetron (ZOFRAN-ODT) 4 MG disintegrating tablet Take 1 tablet (4 mg total) by mouth every 8 (eight) hours as needed. 08/03/19   Faythe Ghee, PA-C    Allergies Patient has no known allergies.  History reviewed. No pertinent family history.  Social History Social History   Tobacco Use  . Smoking status: Current Every Day Smoker    Packs/day: 0.25    Years: 4.00    Pack years: 1.00    Types: Cigarettes  . Smokeless tobacco: Never Used  Substance Use Topics  . Alcohol use: No    Comment: Occasional  . Drug use: No    Review of Systems  Constitutional: No fever/chills Eyes: No visual changes. ENT: Positive sore  throat. Respiratory: Denies cough Cardiovascular: Denies chest pain Gastrointestinal: Denies abdominal pain, positive for nausea/vomiting Genitourinary: Negative for dysuria. Musculoskeletal: Negative for back pain. Skin: Negative for rash. Psychiatric: no mood changes,     ____________________________________________   PHYSICAL EXAM:  VITAL SIGNS: ED Triage Vitals [08/03/19 1829]  Enc Vitals Group     BP 125/61     Pulse Rate 99     Resp      Temp 99 F (37.2 C)     Temp Source Oral     SpO2 99 %     Weight      Height      Head Circumference      Peak Flow      Pain Score 4     Pain Loc      Pain Edu?      Excl. in GC?     Constitutional: Alert and oriented. Well appearing and in no acute distress. Eyes: Conjunctivae are normal.  Head: Atraumatic. Nose: No congestion/rhinnorhea. Mouth/Throat: Mucous membranes are moist.  Throat is mildly red Neck:  supple no lymphadenopathy noted Cardiovascular: Normal rate, regular rhythm. Heart sounds are normal Respiratory: Normal respiratory effort.  No retractions, lungs c t a  Abd: soft nontender bs normal all 4 quad GU: deferred Musculoskeletal: FROM all extremities, warm and well perfused Neurologic:  Normal speech and language.  Skin:  Skin is warm, dry and intact. No rash noted. Psychiatric: Mood and affect are normal. Speech and behavior  are normal.  ____________________________________________   LABS (all labs ordered are listed, but only abnormal results are displayed)  Labs Reviewed  COMPREHENSIVE METABOLIC PANEL - Abnormal; Notable for the following components:      Result Value   AST 14 (*)    All other components within normal limits  CBC - Abnormal; Notable for the following components:   WBC 10.7 (*)    HCT 35.5 (*)    All other components within normal limits  URINALYSIS, COMPLETE (UACMP) WITH MICROSCOPIC - Abnormal; Notable for the following components:   Color, Urine YELLOW (*)    APPearance  HAZY (*)    Specific Gravity, Urine 1.031 (*)    Hgb urine dipstick SMALL (*)    All other components within normal limits  GROUP A STREP BY PCR  LIPASE, BLOOD  PREGNANCY, URINE  POC URINE PREG, ED   ____________________________________________   ____________________________________________  RADIOLOGY    ____________________________________________   PROCEDURES  Procedure(s) performed: No  Procedures    ____________________________________________   INITIAL IMPRESSION / ASSESSMENT AND PLAN / ED COURSE  Pertinent labs & imaging results that were available during my care of the patient were reviewed by me and considered in my medical decision making (see chart for details).   Patient is 24 year old female presents emergency department with sore throat, nausea/vomiting.  See HPI  Physical exam patient appears well.  Vitals are normal.  Remainder the exam is unremarkable  Strep test is negative CBC has mildly elevated WBC of 10.7, comprehensive metabolic panel is normal, lipase normal, urinalysis is normal, urine pregnancy is negative  Explained findings to the patient.  She said no vomiting while here in the ED.  She was given Zofran for IV and will do a p.o. challenge.  Prescription for Zofran will be sent to her pharmacy.     Darlene Kelley was evaluated in Emergency Department on 08/03/2019 for the symptoms described in the history of present illness. She was evaluated in the context of the global COVID-19 pandemic, which necessitated consideration that the patient might be at risk for infection with the SARS-CoV-2 virus that causes COVID-19. Institutional protocols and algorithms that pertain to the evaluation of patients at risk for COVID-19 are in a state of rapid change based on information released by regulatory bodies including the CDC and federal and state organizations. These policies and algorithms were followed during the patient's care in the ED.   As part of  my medical decision making, I reviewed the following data within the [place] notes reviewed and incorporated, Labs reviewed see above, Old chart reviewed, Notes from prior ED visits and [place] Controlled Substance Database  ____________________________________________   FINAL CLINICAL IMPRESSION(S) / ED DIAGNOSES  Final diagnoses:  Nausea and vomiting in adult      NEW MEDICATIONS STARTED DURING THIS VISIT:  New Prescriptions   ONDANSETRON (ZOFRAN-ODT) 4 MG DISINTEGRATING TABLET    Take 1 tablet (4 mg total) by mouth every 8 (eight) hours as needed.     Note:  This document was prepared using Dragon voice recognition software and may include unintentional dictation errors.    Versie Starks, PA-C 08/03/19 2015    Lilia Pro., MD 08/04/19 [phone]

## 2019-08-03 NOTE — ED Triage Notes (Signed)
Pt presents to ED via POV with c/o emesis this morning and sore throat. Pt ambulatory without difficulty at this time. A&O x4, NAD noted, respirations even and unlabored.

## 2019-08-04 LAB — POCT PREGNANCY, URINE: Preg Test, Ur: NEGATIVE

## 2019-12-04 ENCOUNTER — Other Ambulatory Visit (HOSPITAL_COMMUNITY): Payer: Self-pay | Admitting: Otolaryngology

## 2019-12-04 ENCOUNTER — Other Ambulatory Visit: Payer: Self-pay | Admitting: Otolaryngology

## 2019-12-04 DIAGNOSIS — E041 Nontoxic single thyroid nodule: Secondary | ICD-10-CM

## 2019-12-16 ENCOUNTER — Ambulatory Visit: Payer: Medicaid Other

## 2019-12-23 ENCOUNTER — Other Ambulatory Visit: Payer: Self-pay

## 2019-12-23 ENCOUNTER — Ambulatory Visit
Admission: RE | Admit: 2019-12-23 | Discharge: 2019-12-23 | Disposition: A | Discharge status: Home / Self Care | Payer: Medicaid Other | Source: Ambulatory Visit | Attending: Otolaryngology | Admitting: Otolaryngology

## 2019-12-23 DIAGNOSIS — E041 Nontoxic single thyroid nodule: Secondary | ICD-10-CM

## 2019-12-26 ENCOUNTER — Other Ambulatory Visit: Payer: Self-pay | Admitting: Otolaryngology

## 2019-12-26 DIAGNOSIS — E041 Nontoxic single thyroid nodule: Secondary | ICD-10-CM

## 2020-01-02 ENCOUNTER — Ambulatory Visit
Admission: RE | Admit: 2020-01-02 | Discharge: 2020-01-02 | Disposition: A | Discharge status: Home / Self Care | Payer: Medicaid Other | Source: Ambulatory Visit | Attending: Otolaryngology | Admitting: Otolaryngology

## 2020-01-02 ENCOUNTER — Other Ambulatory Visit: Payer: Self-pay

## 2020-01-02 DIAGNOSIS — E042 Nontoxic multinodular goiter: Secondary | ICD-10-CM | POA: Diagnosis not present

## 2020-01-02 DIAGNOSIS — E041 Nontoxic single thyroid nodule: Secondary | ICD-10-CM

## 2020-01-02 NOTE — Procedures (Addendum)
Ultrasound-guided fine-needle aspiration biopsies were performed of right lower pole (x6) and left midpole thyroid(x5) nodules via 25 gauge needles.  Medication used-1 percent lidocaine to skin and subcutaneous tissue.  No immediate complications. Pathology pending.EBL none.

## 2020-01-02 NOTE — Discharge Instructions (Signed)
Thyroid Needle Biopsy, Care After This sheet gives you information about how to care for yourself after your procedure. Your health care provider may also give you more specific instructions. If you have problems or questions, contact your health care provider. What can I expect after the procedure? After the procedure, it is common to have:  Soreness and tenderness that lasts for a few days.  Bruising where the needle was inserted (puncture site). Follow these instructions at home:   Take over-the-counter and prescription medicines only as told by your health care provider.  To help ease discomfort, keep your head raised (elevated) when you are lying down. When you move from lying down to sitting up, use both hands to support the back of your head and neck.  Check your puncture site every day for signs of infection. Check for: ? Redness, swelling, or pain. ? Fluid or blood. ? Warmth. ? Pus or a bad smell.  Return to your normal activities as told by your health care provider. Ask your health care provider what activities are safe for you.  Keep all follow-up visits as told by your health care provider. This is important. Contact a health care provider if:  You have redness, swelling, or pain around your puncture site.  You have fluid or blood coming from your puncture site.  Your puncture site feels warm to the touch.  You have pus or a bad smell coming from your puncture site.  You have a fever. Get help right away if:  You have severe bleeding from the puncture site.  You have difficulty swallowing.  You have swollen glands (lymph nodes) in your neck. Summary  It is common to have some bruising and soreness where the needle was inserted in your lower front neck area (puncture site).  Check your puncture site every day for signs of infection, such as redness, swelling, or pain.  Get help right away if you have severe bleeding from your puncture site. This  information is not intended to replace advice given to you by your health care provider. Make sure you discuss any questions you have with your health care provider. Document Revised: 05/25/2017 Document Reviewed: 03/26/2017 Elsevier Patient Education  2020 [employer]vier [employer]  

## 2020-01-05 LAB — CYTOLOGY - NON PAP

## 2020-01-13 ENCOUNTER — Other Ambulatory Visit: Payer: Self-pay | Admitting: Family Medicine

## 2020-01-13 DIAGNOSIS — Z3201 Encounter for pregnancy test, result positive: Secondary | ICD-10-CM

## 2020-01-19 ENCOUNTER — Ambulatory Visit
Admission: RE | Admit: 2020-01-19 | Discharge: 2020-01-19 | Disposition: A | Discharge status: Home / Self Care | Payer: Medicaid Other | Source: Ambulatory Visit | Attending: Family Medicine | Admitting: Family Medicine

## 2020-01-19 ENCOUNTER — Other Ambulatory Visit: Payer: Self-pay

## 2020-01-19 DIAGNOSIS — Z3201 Encounter for pregnancy test, result positive: Secondary | ICD-10-CM | POA: Diagnosis not present

## 2020-04-20 ENCOUNTER — Other Ambulatory Visit (HOSPITAL_COMMUNITY): Payer: Self-pay | Admitting: Family Medicine

## 2020-04-20 ENCOUNTER — Other Ambulatory Visit: Payer: Self-pay | Admitting: Family Medicine

## 2020-04-20 DIAGNOSIS — Z3482 Encounter for supervision of other normal pregnancy, second trimester: Secondary | ICD-10-CM

## 2020-04-28 ENCOUNTER — Ambulatory Visit: Payer: Medicaid Other

## 2020-06-22 ENCOUNTER — Observation Stay: Payer: Medicaid Other

## 2020-06-22 ENCOUNTER — Observation Stay
Admission: EM | Admit: 2020-06-22 | Discharge: 2020-06-23 | Disposition: A | Discharge status: Home / Self Care | Payer: Medicaid Other | Attending: Internal Medicine | Admitting: Internal Medicine

## 2020-06-22 ENCOUNTER — Emergency Department: Payer: Medicaid Other

## 2020-06-22 ENCOUNTER — Encounter: Payer: Self-pay | Admitting: Emergency Medicine

## 2020-06-22 ENCOUNTER — Other Ambulatory Visit: Payer: Self-pay

## 2020-06-22 DIAGNOSIS — O99333 Smoking (tobacco) complicating pregnancy, third trimester: Secondary | ICD-10-CM | POA: Diagnosis not present

## 2020-06-22 DIAGNOSIS — O99283 Endocrine, nutritional and metabolic diseases complicating pregnancy, third trimester: Secondary | ICD-10-CM | POA: Insufficient documentation

## 2020-06-22 DIAGNOSIS — E876 Hypokalemia: Secondary | ICD-10-CM

## 2020-06-22 DIAGNOSIS — O2230 Deep phlebothrombosis in pregnancy, unspecified trimester: Secondary | ICD-10-CM

## 2020-06-22 DIAGNOSIS — U071 COVID-19: Secondary | ICD-10-CM | POA: Diagnosis present

## 2020-06-22 DIAGNOSIS — F17210 Nicotine dependence, cigarettes, uncomplicated: Secondary | ICD-10-CM | POA: Insufficient documentation

## 2020-06-22 DIAGNOSIS — O98513 Other viral diseases complicating pregnancy, third trimester: Secondary | ICD-10-CM | POA: Diagnosis not present

## 2020-06-22 DIAGNOSIS — Z3490 Encounter for supervision of normal pregnancy, unspecified, unspecified trimester: Secondary | ICD-10-CM

## 2020-06-22 DIAGNOSIS — M791 Myalgia, unspecified site: Secondary | ICD-10-CM

## 2020-06-22 DIAGNOSIS — E860 Dehydration: Secondary | ICD-10-CM

## 2020-06-22 DIAGNOSIS — Z3A20 20 weeks gestation of pregnancy: Secondary | ICD-10-CM | POA: Diagnosis not present

## 2020-06-22 DIAGNOSIS — R509 Fever, unspecified: Secondary | ICD-10-CM

## 2020-06-22 DIAGNOSIS — O99280 Endocrine, nutritional and metabolic diseases complicating pregnancy, unspecified trimester: Secondary | ICD-10-CM

## 2020-06-22 LAB — COMPREHENSIVE METABOLIC PANEL
ALT: 13 U/L (ref 0–44)
AST: 18 U/L (ref 15–41)
Albumin: 2.8 g/dL — ABNORMAL LOW (ref 3.5–5.0)
Alkaline Phosphatase: 78 U/L (ref 38–126)
Anion gap: 8 (ref 5–15)
BUN: 7 mg/dL (ref 6–20)
CO2: 20 mmol/L — ABNORMAL LOW (ref 22–32)
Calcium: 8.9 mg/dL (ref 8.9–10.3)
Chloride: 103 mmol/L (ref 98–111)
Creatinine, Ser: 0.38 mg/dL — ABNORMAL LOW (ref 0.44–1.00)
GFR, Estimated: >60 mL/min (ref >60)
Glucose, Bld: 80 mg/dL (ref 70–99)
Potassium: 3.4 mmol/L — ABNORMAL LOW (ref 3.5–5.1)
Sodium: 131 mmol/L — ABNORMAL LOW (ref 135–145)
Total Bilirubin: 1 mg/dL (ref 0.3–1.2)
Total Protein: 6.6 g/dL (ref 6.5–8.1)

## 2020-06-22 LAB — CBC
HCT: 33.4 % — ABNORMAL LOW (ref 36.0–46.0)
Hemoglobin: 11.7 g/dL — ABNORMAL LOW (ref 12.0–15.0)
MCH: 30.4 pg (ref 26.0–34.0)
MCHC: 35 g/dL (ref 30.0–36.0)
MCV: 86.8 fL (ref 80.0–100.0)
Platelets: 159 10*3/uL (ref 150–400)
RBC: 3.85 MIL/uL — ABNORMAL LOW (ref 3.87–5.11)
RDW: 12.5 % (ref 11.5–15.5)
WBC: 9.3 10*3/uL (ref 4.0–10.5)
nRBC: 0 % (ref 0.0–0.2)

## 2020-06-22 LAB — URINALYSIS, COMPLETE (UACMP) WITH MICROSCOPIC
Bilirubin Urine: NEGATIVE
Glucose, UA: >=500 mg/dL — AB
Ketones, ur: NEGATIVE mg/dL
Leukocytes,Ua: NEGATIVE
Nitrite: NEGATIVE
Protein, ur: NEGATIVE mg/dL
Specific Gravity, Urine: 1.001 — ABNORMAL LOW (ref 1.005–1.030)
Squamous Epithelial / HPF: NONE SEEN (ref 0–5)
pH: 7 (ref 5.0–8.0)

## 2020-06-22 LAB — LIPASE, BLOOD: Lipase: 22 U/L (ref 11–51)

## 2020-06-22 LAB — RESP PANEL BY RT-PCR (FLU A&B, COVID) ARPGX2
Influenza A by PCR: NEGATIVE
Influenza B by PCR: NEGATIVE
SARS Coronavirus 2 by RT PCR: POSITIVE — AB

## 2020-06-22 LAB — FIBRINOGEN: Fibrinogen: 413 mg/dL (ref 210–475)

## 2020-06-22 LAB — FERRITIN: Ferritin: 17 ng/mL (ref 11–307)

## 2020-06-22 LAB — MAGNESIUM: Magnesium: 1.6 mg/dL — ABNORMAL LOW (ref 1.7–2.4)

## 2020-06-22 LAB — HCG, QUANTITATIVE, PREGNANCY: hCG, Beta Chain, Quant, S: 33417 m[IU]/mL — ABNORMAL HIGH (ref <5)

## 2020-06-22 LAB — LACTATE DEHYDROGENASE: LDH: 89 U/L — ABNORMAL LOW (ref 98–192)

## 2020-06-22 LAB — PROCALCITONIN: Procalcitonin: <0.1 ng/mL

## 2020-06-22 LAB — GROUP A STREP BY PCR: Group A Strep by PCR: NOT DETECTED

## 2020-06-22 LAB — LACTIC ACID, PLASMA
Lactic Acid, Venous: 1 mmol/L (ref 0.5–1.9)
Lactic Acid, Venous: 1.6 mmol/L (ref 0.5–1.9)

## 2020-06-22 LAB — FIBRIN DERIVATIVES D-DIMER (ARMC ONLY): Fibrin derivatives D-dimer (ARMC): 886.79 ng/mL (FEU) — ABNORMAL HIGH (ref 0.00–499.00)

## 2020-06-22 MED ORDER — ONDANSETRON HCL 4 MG/2ML IJ SOLN: 4.0000 mg | Freq: Four times a day (QID) | INTRAMUSCULAR | Status: DC | PRN

## 2020-06-22 MED ORDER — DEXTROSE IN LACTATED RINGERS 5 % IV SOLN: INTRAVENOUS | Status: AC

## 2020-06-22 MED ORDER — ONDANSETRON 4 MG PO TBDP
4.0000 mg | ORAL_TABLET | Freq: Once | ORAL | Status: AC
  Administered 2020-06-22: 4 mg via ORAL

## 2020-06-22 MED ORDER — ONDANSETRON HCL 4 MG PO TABS: 4.0000 mg | ORAL_TABLET | Freq: Four times a day (QID) | ORAL | Status: DC | PRN

## 2020-06-22 MED ORDER — ONDANSETRON HCL 4 MG/2ML IJ SOLN
INTRAMUSCULAR | Status: AC
  Administered 2020-06-22: 4 mg via INTRAVENOUS
  Filled 2020-06-22: qty 2

## 2020-06-22 MED ORDER — ACETAMINOPHEN 325 MG PO TABS: 650.0000 mg | ORAL_TABLET | Freq: Four times a day (QID) | ORAL | Status: DC | PRN

## 2020-06-22 MED ORDER — HEPARIN SODIUM (PORCINE) 5000 UNIT/ML IJ SOLN
5000.0000 [IU] | Freq: Three times a day (TID) | INTRAMUSCULAR | Status: DC
  Administered 2020-06-22 – 2020-06-23 (×2): 5000 [IU] via SUBCUTANEOUS
  Filled 2020-06-22 (×2): qty 1

## 2020-06-22 MED ORDER — DOXYLAMINE SUCCINATE (SLEEP) 25 MG PO TABS
25.0000 mg | ORAL_TABLET | Freq: Once | ORAL | Status: AC
  Administered 2020-06-22: 13:00:00 25 mg via ORAL
  Filled 2020-06-22: qty 1

## 2020-06-22 MED ORDER — LACTATED RINGERS IV BOLUS
1000.0000 mL | Freq: Once | INTRAVENOUS | Status: AC
  Administered 2020-06-22: 1000 mL via INTRAVENOUS

## 2020-06-22 MED ORDER — POTASSIUM CHLORIDE CRYS ER 20 MEQ PO TBCR
80.0000 meq | EXTENDED_RELEASE_TABLET | Freq: Once | ORAL | Status: AC
  Administered 2020-06-22: 13:00:00 80 meq via ORAL
  Filled 2020-06-22: qty 4

## 2020-06-22 MED ORDER — SODIUM CHLORIDE 0.9 % IV SOLN: INTRAVENOUS | Status: DC

## 2020-06-22 MED ORDER — CEPHALEXIN 500 MG PO CAPS
500.0000 mg | ORAL_CAPSULE | Freq: Four times a day (QID) | ORAL | Status: DC
  Administered 2020-06-22 – 2020-06-23 (×3): 500 mg via ORAL
  Filled 2020-06-22 (×3): qty 1

## 2020-06-22 MED ORDER — DEXTROSE 5 % IN LACTATED RINGERS IV BOLUS
1000.0000 mL | Freq: Once | INTRAVENOUS | Status: AC
  Administered 2020-06-22: 14:00:00 1000 mL via INTRAVENOUS
  Filled 2020-06-22: qty 1000

## 2020-06-22 MED ORDER — PYRIDOXINE HCL 100 MG/ML IJ SOLN
100.0000 mg | Freq: Once | INTRAMUSCULAR | Status: AC
  Administered 2020-06-22: 13:00:00 100 mg via INTRAVENOUS
  Filled 2020-06-22: qty 1

## 2020-06-22 MED ORDER — ACETAMINOPHEN 650 MG RE SUPP: 650.0000 mg | Freq: Four times a day (QID) | RECTAL | Status: DC | PRN

## 2020-06-22 MED ORDER — ONDANSETRON HCL 4 MG/2ML IJ SOLN: 4.0000 mg | Freq: Once | INTRAMUSCULAR | Status: AC

## 2020-06-22 MED ORDER — ONDANSETRON 4 MG PO TBDP
ORAL_TABLET | ORAL | Status: AC
  Filled 2020-06-22: qty 1

## 2020-06-22 MED ORDER — MAGNESIUM SULFATE 2 GM/50ML IV SOLN
2.0000 g | Freq: Once | INTRAVENOUS | Status: AC
  Administered 2020-06-22: 13:00:00 2 g via INTRAVENOUS
  Filled 2020-06-22: qty 50

## 2020-06-22 MED ORDER — ACETAMINOPHEN 500 MG PO TABS
1000.0000 mg | ORAL_TABLET | Freq: Once | ORAL | Status: AC
  Administered 2020-06-22: 12:00:00 1000 mg via ORAL
  Filled 2020-06-22: qty 2

## 2020-06-22 NOTE — ED Triage Notes (Signed)
Patient to ER for c/o vomiting, chills, sore throat and fever (highest 102) x2 days. Denies abd pain. Reports approx 4+ episodes of emesis. Patient is approx 7 months pregnant (sees Phine[facility]).

## 2020-06-22 NOTE — ED Notes (Signed)
Ultrasound at bedside at this time.

## 2020-06-22 NOTE — ED Provider Notes (Signed)
[facility] Emergency Department Provider Note  ____________________________________________   Event Date/Time   First MD Initiated Contact with Patient 06/22/20 1122     (approximate)  I have reviewed the triage vital signs and the nursing notes.   HISTORY  Chief Complaint Emesis, Fever, and Sore Throat (/)   HPI Darlene Kelley is a 24 y.o. female with past medical history of GERD approximately 7 months pregnant who presents for assessment of 2 days of fevers, chills, myalgias, sore throat, nausea, nonbloody nonbilious vomiting and poor p.o. intake.  Patient denies any chest pain, abdominal pain, back pain, urinary symptoms, vaginal bleeding or abnormal discharge, diarrhea, rash, or acute extremity pain.  She is not currently taking any medications.  She follows with [facility] for her pregnancy.  Denies any earache, vision changes, vertigo or other acute sick symptoms.  Denies tobacco abuse, EtOH use or illicit drug use.          Past Medical History:  Diagnosis Date  . GERD (gastroesophageal reflux disease)     Patient Active Problem List   Diagnosis Date Noted  . Encounter for supervision of other normal pregnancy 05/28/2016  . PROM (premature rupture of membranes) 05/28/2016    Past Surgical History:  Procedure Laterality Date  . WISDOM TOOTH EXTRACTION      Prior to Admission medications   Not on File    Allergies Patient has no known allergies.  No family history on file.  Social History Social History   Tobacco Use  . Smoking status: Current Every Day Smoker    Packs/day: 0.25    Years: 4.00    Pack years: 1.00    Types: Cigarettes  . Smokeless tobacco: Never Used  Vaping Use  . Vaping Use: Every day  Substance Use Topics  . Alcohol use: No    Comment: Occasional  . Drug use: No    Review of Systems  Review of Systems  Constitutional: Positive for chills, fever and malaise/fatigue.  HENT: Positive for congestion.  Negative for sore throat.   Eyes: Negative for pain.  Respiratory: Positive for cough. Negative for stridor.   Cardiovascular: Negative for chest pain.  Gastrointestinal: Positive for nausea and vomiting.  Genitourinary: Negative for dysuria.  Musculoskeletal: Positive for myalgias.  Skin: Negative for rash.  Neurological: Negative for seizures, loss of consciousness and headaches.  Psychiatric/Behavioral: Negative for suicidal ideas.  All other systems reviewed and are negative.     ____________________________________________   PHYSICAL EXAM:  VITAL SIGNS: ED Triage Vitals  Enc Vitals Group     BP 06/22/20 1105 92/64     Pulse Rate 06/22/20 1105 (!) 137     Resp 06/22/20 1105 18     Temp 06/22/20 1105 (!) 100.5 F (38.1 C)     Temp Source 06/22/20 1105 Oral     SpO2 06/22/20 1105 99 %     Weight 06/22/20 1014 130 lb (59 kg)     Height 06/22/20 1014 5\' 6"  (1.676 m)     Head Circumference --      Peak Flow --      Pain Score 06/22/20 1014 10     Pain Loc --      Pain Edu? --      Excl. in GC? --    Vitals:   06/22/20 1615 06/22/20 1749  BP: 103/64 115/77  Pulse: (!) 107 (!) 106  Resp: 20 16  Temp:  98.9 F (37.2 C)  SpO2:  100% 100%   Physical Exam Vitals and nursing note reviewed.  Constitutional:      General: She is not in acute distress.    Appearance: She is well-developed and well-nourished.  HENT:     Head: Normocephalic and atraumatic.     Right Ear: External ear normal.     Left Ear: External ear normal.     Nose: Nose normal.     Mouth/Throat:     Mouth: Mucous membranes are dry.  Eyes:     Conjunctiva/sclera: Conjunctivae normal.  Cardiovascular:     Rate and Rhythm: Regular rhythm. Tachycardia present.     Heart sounds: No murmur heard.   Pulmonary:     Effort: Pulmonary effort is normal. No respiratory distress.     Breath sounds: Normal breath sounds.  Abdominal:     Palpations: Abdomen is soft.     Tenderness: There is no abdominal  tenderness.  Musculoskeletal:        General: No edema.     Cervical back: Neck supple.  Skin:    General: Skin is warm and dry.     Capillary Refill: Capillary refill takes 2 to 3 seconds.  Neurological:     Mental Status: She is alert and oriented to person, place, and time.  Psychiatric:        Mood and Affect: Mood and affect and mood normal.     Abdomen is gravid. ____________________________________________   LABS (all labs ordered are listed, but only abnormal results are displayed)  Labs Reviewed  RESP PANEL BY RT-PCR (FLU A&B, COVID) ARPGX2 - Abnormal; Notable for the following components:      Result Value   SARS Coronavirus 2 by RT PCR POSITIVE (*)    All other components within normal limits  COMPREHENSIVE METABOLIC PANEL - Abnormal; Notable for the following components:   Sodium 131 (*)    Potassium 3.4 (*)    CO2 20 (*)    Creatinine, Ser 0.38 (*)    Albumin 2.8 (*)    All other components within normal limits  CBC - Abnormal; Notable for the following components:   RBC 3.85 (*)    Hemoglobin 11.7 (*)    HCT 33.4 (*)    All other components within normal limits  URINALYSIS, COMPLETE (UACMP) WITH MICROSCOPIC - Abnormal; Notable for the following components:   Color, Urine STRAW (*)    APPearance CLEAR (*)    Specific Gravity, Urine 1.001 (*)    Glucose, UA >=500 (*)    Hgb urine dipstick SMALL (*)    Bacteria, UA RARE (*)    All other components within normal limits  HCG, QUANTITATIVE, PREGNANCY - Abnormal; Notable for the following components:   hCG, Beta Chain, Quant, S 33,417 (*)    All other components within normal limits  MAGNESIUM - Abnormal; Notable for the following components:   Magnesium 1.6 (*)    All other components within normal limits  GROUP A STREP BY PCR  URINE CULTURE  CULTURE, BLOOD (ROUTINE X 2)  CULTURE, BLOOD (ROUTINE X 2)  LIPASE, BLOOD  LACTIC ACID, PLASMA  LACTIC ACID, PLASMA  FIBRIN DERIVATIVES D-DIMER ([facility] ONLY)   PROCALCITONIN  LACTATE DEHYDROGENASE  FERRITIN  TRIGLYCERIDES  FIBRINOGEN  C-REACTIVE PROTEIN  POC SARS CORONAVIRUS 2 AG -  ED   ____________________________________________  EKG  Sinus tachycardia with ventricular rate of 115, normal axis, unremarkable intervals, no clear evidence of acute ischemia. ____________________________________________  RADIOLOGY  ED MD interpretation: No  pneumothorax, effusion, overt edema, or focal consolidation.   Official radiology report(s): DG Chest 1 View  Result Date: 06/22/2020 CLINICAL DATA:  Tachycardia. EXAM: CHEST  1 VIEW COMPARISON:  06/24/2019 FINDINGS: The heart size and mediastinal contours are within normal limits. Both lungs are clear. The visualized skeletal structures are unremarkable. IMPRESSION: No active disease. Electronically Signed   By: Marlan Palauharles  Clark M.D.   On: 06/22/2020 11:52   US OB Limited  Result Date: 06/22/2020 CLINICAL DATA:  Dehydration. EXAM: LIMITED OBSTETRIC ULTRASOUND FINDINGS: Number of Fetuses: 1 Heart Rate:  171 bpm Movement: Yes Presentation: Breech Placental Location: Posterior Previa: No Amniotic Fluid (Subjective):  Within normal limits. AFI: 14.2 cm BPD: 7.1 cm 28 w  2 d MATERNAL FINDINGS: Cervix:  Appears closed. Uterus/Adnexae: No abnormality visualized. IMPRESSION: Single live intrauterine gestation of 28 weeks 2 days. This exam is performed on an emergent basis and does not comprehensively evaluate fetal size, dating, or anatomy; follow-up complete OB US should be considered if further fetal assessment is warranted. Electronically Signed   By: Lupita RaiderJames  Green Jr M.D.   On: 06/22/2020 13:34    ____________________________________________   PROCEDURES  Procedure(s) performed (including Critical Care):  .1-3 Lead EKG Interpretation Performed by: Gilles ChiquitoSmith, Logen Fowle P, MD Authorized by: Gilles ChiquitoSmith, Karmah Potocki P, MD     Interpretation: normal     ECG rate assessment: tachycardic     Rhythm: sinus rhythm      Ectopy: none     Conduction: normal       ____________________________________________   INITIAL IMPRESSION / ASSESSMENT AND PLAN / ED COURSE      Patient presents with us to history exam for assessment of myalgias, sore throat, fevers, chills, nausea, and vomiting as described above. On arrival patient is febrile to 100.5 as well as tachycardic with heart rate of 137 with otherwise stable vital signs on room air. On arrival her BP is borderline soft at 92/64.  Patient is Covid positive in emergency room. Suspect this is likely the etiology for her gastritis and myalgias and sore throat. No findings on exam to suggest peritonsillar abscess, retropharyngeal abscess or other deep space infection of the head or neck. Chest x-ray does not show focal consolidation and she is no elevation of her white blood cell count or other findings on exam to suggest bacterial pneumonia. EKG shows no significant arrhythmia or evidence of ischemia. Lipase not consistent with pancreatitis. CMP remarkable for mild hyponatremia with NA of 131 as well as mild hypokalemia with a K of 3.4 and very mild nongap acidosis with a bicarb of 20 as well as hypoalbuminemia with albumin of 2.8. No other significant ocular metabolic derangements. CBC unremarkable. Magnesium 1.6. Lactic acid 1. Low suspicion for sepsis or bacteremia at this time. UA does have some small blood and rare bacteria no evidence of infection and given no symptoms or CVA tenderness have a low suspicion for cystitis or pyelonephritis. However patient will require treatment for asymptomatic bacteriuria in pregnancy. Strep screen is negative.  Patient given potassium, magnesium, 2 L of IV fluids and antiemetics as noted below. However on reassessment she states she was still feeling nauseous and only been able take a few sips of fluid. She was also persistently tachycardic and had persistent soft blood pressure with recheck at 103/64.  Ultrasound reviewed by  myself shows a live IUP approximately 28 weeks and 3 days by size with no other significant complications. Discussed plan to admit to hospital service with on-call obstetrician Dr.  Dalbert Garnet he stated she would make some recommendations regarding fetal monitoring.  I will plan to admit to hospital service for observation and continued IV hydration.    ____________________________________________   FINAL CLINICAL IMPRESSION(S) / ED DIAGNOSES  Final diagnoses:  COVID-19  Pregnancy, unspecified gestational age  Hypomagnesemia  Hypokalemia  Dehydration    Medications  dextrose 5 % in lactated ringers infusion (has no administration in time range)  cephALEXin (KEFLEX) capsule 500 mg (has no administration in time range)  ondansetron (ZOFRAN-ODT) disintegrating tablet 4 mg (4 mg Oral Given 06/22/20 1100)  ondansetron (ZOFRAN) injection 4 mg (4 mg Intravenous Given 06/22/20 1119)  lactated ringers bolus 1,000 mL (0 mLs Intravenous Stopped 06/22/20 1342)  acetaminophen (TYLENOL) tablet 1,000 mg (1,000 mg Oral Given 06/22/20 1212)  doxylamine (Sleep) (UNISOM) tablet 25 mg (25 mg Oral Given 06/22/20 1239)  pyridOXINE (B-6) injection 100 mg (100 mg Intravenous Given 06/22/20 1239)  potassium chloride SA (KLOR-CON) CR tablet 80 mEq (80 mEq Oral Given 06/22/20 1238)  magnesium sulfate IVPB 2 g 50 mL (0 g Intravenous Stopped 06/22/20 1347)  dextrose 5% lactated ringers bolus 1,000 mL (1,000 mLs Intravenous New Bag/Given 06/22/20 1347)     ED Discharge Orders    None       Note:  This document was prepared using Dragon voice recognition software and may include unintentional dictation errors.   Gilles Chiquito, MD 06/22/20 [place]

## 2020-06-22 NOTE — H&P (Addendum)
History and Physical    Darlene Kelley OEH:[id number] DOB: [birth year] DOA: 06/22/2020  PCP: Darlene Coffin, MD  Patient coming from: home  I have personally briefly reviewed patient's old medical records in [place]  Chief Complaint: fever/chills/ muscle aches Darlene Kelley throat x 1 day  HPI: Darlene Kelley is a 24 y.o. female with no significant medical history who presents to ED with one day history of fever/chills/ muscle aches /sore throat with persistent nausea and vomiting. Patient noted her child have has mild uri recently. She currently denies sob, chest pain, palpitations, presyncope, diarrhea, lost of sense of taste or smell. She notes she feel generally very weak. She is currently around [redacted] weeks pregnant. She notes that baby is moving as usually no abdominal pain, no abnormal bleeding.     ED Course:  Temp 100.5, bp 92/64, hr 137, rr 18, sat 99% on ra Labs: Wbc: 9.3, hgb 11.7 close to baseline of12, mcv 86, plt 159 Na:131(prior 140),K 3.4, bicarb, 20, cr 0.38, mag 1.6,lactic 1 UA -rare bacteria + glucose Strep negative CXR:NAD IBB:CWUGQ tach no acute st -t wave changes   Tx:  LR 3L, tylenol, B6,unisom,mag 2gram,cephalexin Review of Systems: As per HPI otherwise 10 point review of systems negative.   Past Medical History:  Diagnosis Date  . GERD (gastroesophageal reflux disease)     Past Surgical History:  Procedure Laterality Date  . WISDOM TOOTH EXTRACTION       reports that she has been smoking cigarettes. She has a 1.00 pack-year smoking history. She has never used smokeless tobacco. She reports that she does not drink alcohol and does not use drugs.  No Known Allergies  No family history on file. HTN,BRCA, CVA, LUNG CA   Social Hx: No ETOH  +Tobacco   Prior to Admission medications   Not on File    Physical Exam: Vitals:   06/22/20 1600 06/22/20 1615 06/22/20 1749 06/22/20 1847  BP: 93/65 103/64 115/77 111/72  Pulse: (!) 112 (!) 107 (!) 106 (!)  108  Resp:  _0 Temp:   98.9 F (37.2 C)   TempSrc:   Oral   SpO2: 100% 100% 100% 100%  Weight:      Height:        Constitutional: NAD, calm, comfortable Vitals:   06/22/20 1600 06/22/20 1615 06/22/20 1749 06/22/20 1847  BP: 93/65 103/64 115/77 111/72  Pulse: (!) 112 (!) 107 (!) 106 (!) 108  Resp:  _1 Temp:   98.9 F (37.2 C)   TempSrc:   Oral   SpO2: 100% 100% 100% 100%  Weight:      Height:       Eyes: PERRL, lids and conjunctivae normal ENMT: Mucous membranes are moist. Normal dentition.  Neck: normal, supple, no masses, no thyromegaly Respiratory: clear to auscultation bilaterally, no wheezing, no crackles. Normal respiratory effort. No accessory muscle use.  Cardiovascular: Regular rate and rhythm, no murmurs / rubs / gallops. No extremity edema. 2+ pedal pulses. No carotid bruits.  Abdomen: no tenderness, no masses palpated. No hepatosplenomegaly. Bowel sounds positive.  Musculoskeletal: no clubbing / cyanosis. No joint deformity upper and lower extremities. Good ROM, no contractures. Normal muscle tone.  Skin: no rashes, lesions, ulcers. No induration Neurologic: CN 2-12 grossly intact. Sensation intact, DTR normal. Strength 5/5 in all 4.  Psychiatric: Normal judgment and insight. Alert and oriented x 3. Normal mood.    Labs on Admission: I have personally  reviewed following labs and imaging studies  CBC: Recent Labs  Lab 06/22/20 1057  WBC 9.3  HGB 11.7*  HCT 33.4*  MCV 86.8  PLT 147   Basic Metabolic Panel: Recent Labs  Lab 06/22/20 1057  NA 131*  K 3.4*  CL 103  CO2 20*  GLUCOSE 80  BUN 7  CREATININE 0.38*  CALCIUM 8.9  MG 1.6*   GFR: Estimated Creatinine Clearance: 99.3 mL/min (A) (by C-G formula based on SCr of 0.38 mg/dL (L)). Liver Function Tests: Recent Labs  Lab 06/22/20 1057  AST 18  ALT 13  ALKPHOS 78  BILITOT 1.0  PROT 6.6  ALBUMIN 2.8*   Recent Labs  Lab 06/22/20 1057  LIPASE 22   No results for input(s):  AMMONIA in the last 168 hours. Coagulation Profile: No results for input(s): INR, PROTIME in the last 168 hours. Cardiac Enzymes: No results for input(s): CKTOTAL, CKMB, CKMBINDEX, TROPONINI in the last 168 hours. BNP (last 3 results) No results for input(s): PROBNP in the last 8760 hours. HbA1C: No results for input(s): HGBA1C in the last 72 hours. CBG: No results for input(s): GLUCAP in the last 168 hours. Lipid Profile: No results for input(s): CHOL, HDL, LDLCALC, TRIG, CHOLHDL, LDLDIRECT in the last 72 hours. Thyroid Function Tests: No results for input(s): TSH, T4TOTAL, FREET4, T3FREE, THYROIDAB in the last 72 hours. Anemia Panel: No results for input(s): VITAMINB12, FOLATE, FERRITIN, TIBC, IRON, RETICCTPCT in the last 72 hours. Urine analysis:    Component Value Date/Time   COLORURINE STRAW (A) 06/22/2020 1238   APPEARANCEUR CLEAR (A) 06/22/2020 1238   APPEARANCEUR Clear 11/29/2013 2046   LABSPEC 1.001 (L) 06/22/2020 1238   LABSPEC 1.021 11/29/2013 2046   PHURINE 7.0 06/22/2020 1238   GLUCOSEU >=500 (A) 06/22/2020 1238   GLUCOSEU Negative 11/29/2013 2046   HGBUR SMALL (A) 06/22/2020 1238   BILIRUBINUR NEGATIVE 06/22/2020 1238   BILIRUBINUR Negative 11/29/2013 2046   KETONESUR NEGATIVE 06/22/2020 1238   PROTEINUR NEGATIVE 06/22/2020 1238   NITRITE NEGATIVE 06/22/2020 1238   LEUKOCYTESUR NEGATIVE 06/22/2020 1238   LEUKOCYTESUR Negative 11/29/2013 2046    Radiological Exams on Admission: DG Chest 1 View  Result Date: 06/22/2020 CLINICAL DATA:  Tachycardia. EXAM: CHEST  1 VIEW COMPARISON:  06/24/2019 FINDINGS: The heart size and mediastinal contours are within normal limits. Both lungs are clear. The visualized skeletal structures are unremarkable. IMPRESSION: No active disease. Electronically Signed   By: Franchot Gallo M.D.   On: 06/22/2020 11:52   US OB Limited  Result Date: 06/22/2020 CLINICAL DATA:  Dehydration. EXAM: LIMITED OBSTETRIC ULTRASOUND FINDINGS: Number  of Fetuses: 1 Heart Rate:  171 bpm Movement: Yes Presentation: Breech Placental Location: Posterior Previa: No Amniotic Fluid (Subjective):  Within normal limits. AFI: 14.2 cm BPD: 7.1 cm 28 w  2 d MATERNAL FINDINGS: Cervix:  Appears closed. Uterus/Adnexae: No abnormality visualized. IMPRESSION: Single live intrauterine gestation of 28 weeks 2 days. This exam is performed on an emergent basis and does not comprehensively evaluate fetal size, dating, or anatomy; follow-up complete OB US should be considered if further fetal assessment is warranted. Electronically Signed   By: Marijo Conception M.D.   On: 06/22/2020 13:34    EKG: Independently reviewed. Sinus tachycardia   Assessment/Plan  Mild COVID-19 infection without hypoxemia or pneumonia  -does not at this time meet criteria for inpatient covid -19 treatment -however, can consider monoclonal antibody  if available , pre pharmacy possible availability on 12/31 -will check COVID disease severity  labs  -will monitor on tele and continuous pulse ox while admitted - heparin sq for prophalxsis  Elevated D-dimer -acute phase reactant in background of pregnancy  - will check ultrasound b/l to be complete and abg  -if patient has respiratory decline will need to consider CTPA   Dehydration  -due to poor po intake due to n/v -gently ivfs overnight   Tachycardia  -due to viral infection  -will continue to monitor on tele and treat supportively with ivfs     G4P3, around 28 weeks  -will order q8h fetal monitoring  Tobacco abuse  -encourage cessation   DVT prophylaxis: heparin  Code Status: FULL  Family Communication: n/a Disposition Plan:patient expected to be admitted less than 2 midnights Consults called:  obg gyn Dr Leafy Ro   Admission status: observation  Clance Boll MD [facility]  If 7PM-7AM, please contact night-coverage [email or web address] Password [facility]  06/22/2020, 8:05 PM

## 2020-06-22 NOTE — ED Notes (Signed)
Walked pt to the restroom. Patient is hooked up to monitor and is resting.

## 2020-06-22 NOTE — Consult Note (Signed)
Consult History and Physical   SERVICE: Obstetrics  Patient Name: Darlene Kelley Patient MRN:   [id number]  CC: Covid sx, pregnancy  HPI: Darlene Kelley is a 24 y.o. [phone] at [redacted]w[redacted]d with covid positive sx. Prenatal care at [facility]. Staying in ER observation overnight for obs and ivf.  Good fetal movement, no vaginal bleeding or cramping.  Review of Systems: positives in bold GEN:   fevers, chills, weight changes, appetite changes, fatigue, night sweats HEENT:  HA, vision changes, hearing loss, congestion, rhinorrhea, sinus pressure, dysphagia CV:   CP, palpitations PULM:  SOB, cough GI:  abd pain, N/V/D/C GU:  dysuria, urgency, frequency MSK:  arthralgias, myalgias, back pain, swelling SKIN:  rashes, color changes, pallor NEURO:  numbness, weakness, tingling, seizures, dizziness, tremors PSYCH:  depression, anxiety, behavioral problems, confusion  HEME/LYMPH:  easy bruising or bleeding ENDO:  heat/cold intolerance  Past Obstetrical History: OB History    Gravida  7   Para  3   Term  3   Preterm  0   AB  3   Living  3     SAB  0   IAB  3   Ectopic  0   Multiple  0   Live Births  3           Past Gynecologic History: No LMP recorded. Patient is pregnant.   Past Medical History: Past Medical History:  Diagnosis Date  . GERD (gastroesophageal reflux disease)     Past Surgical History:   Past Surgical History:  Procedure Laterality Date  . WISDOM TOOTH EXTRACTION      Family History:  family history is not on file.  Social History:  Social History   Socioeconomic History  . Marital status: Single    Spouse name: Not on file  . Number of children: Not on file  . Years of education: Not on file  . Highest education level: Not on file  Occupational History  . Not on file  Tobacco Use  . Smoking status: Current Every Day Smoker    Packs/day: 0.25    Years: 4.00    Pack years: 1.00    Types: Cigarettes  . Smokeless tobacco: Never  Used  Vaping Use  . Vaping Use: Every day  Substance and Sexual Activity  . Alcohol use: No    Comment: Occasional  . Drug use: No  . Sexual activity: Yes  Other Topics Concern  . Not on file  Social History Narrative  . Not on file   Social Determinants of Health   Financial Resource Strain: Not on file  Food Insecurity: Not on file  Transportation Needs: Not on file  Physical Activity: Not on file  Stress: Not on file  Social Connections: Not on file  Intimate Partner Violence: Not on file    Home Medications:  Medications reconciled in EPIC  No current facility-administered medications on file prior to encounter.   No current outpatient medications on file prior to encounter.    Allergies:  No Known Allergies  Physical Exam:  Temp:  [98.9 F (37.2 C)-100.5 F (38.1 C)] 98.9 F (37.2 C) (12/28 1749) Pulse Rate:  [101-137] 108 (12/28 1847) Resp:  [16-20] 19 (12/28 1847) BP: (87-115)/(43-77) 111/72 (12/28 1847) SpO2:  [97 %-100 %] 100 % (12/28 1847) Weight:  [59 kg] 59 kg (12/28 1014)   General Appearance:  Well developed, well nourished, no acute distress, alert and oriented x3  Labs/Studies:   CBC and Coags:  Lab Results  Component Value Date   WBC 9.3 06/22/2020   NEUTOPHILPCT 78 06/24/2019   EOSPCT 1 06/24/2019   BASOPCT 0 06/24/2019   LYMPHOPCT 9 06/24/2019   HGB 11.7 (L) 06/22/2020   HCT 33.4 (L) 06/22/2020   MCV 86.8 06/22/2020   PLT 159 06/22/2020   CMP:  Lab Results  Component Value Date   NA 131 (L) 06/22/2020   K 3.4 (L) 06/22/2020   CL 103 06/22/2020   CO2 20 (L) 06/22/2020   BUN 7 06/22/2020   CREATININE 0.38 (L) 06/22/2020   CREATININE 0.75 08/03/2019   CREATININE 0.78 06/24/2019   PROT 6.6 06/22/2020   BILITOT 1.0 06/22/2020   ALT 13 06/22/2020   AST 18 06/22/2020   ALKPHOS 78 06/22/2020    Other Imaging: DG Chest 1 View  Result Date: 06/22/2020 CLINICAL DATA:  Tachycardia. EXAM: CHEST  1 VIEW COMPARISON:  06/24/2019  FINDINGS: The heart size and mediastinal contours are within normal limits. Both lungs are clear. The visualized skeletal structures are unremarkable. IMPRESSION: No active disease. Electronically Signed   By: Marlan Palau M.D.   On: 06/22/2020 11:52   US OB Limited  Result Date: 06/22/2020 CLINICAL DATA:  Dehydration. EXAM: LIMITED OBSTETRIC ULTRASOUND FINDINGS: Number of Fetuses: 1 Heart Rate:  171 bpm Movement: Yes Presentation: Breech Placental Location: Posterior Previa: No Amniotic Fluid (Subjective):  Within normal limits. AFI: 14.2 cm BPD: 7.1 cm 28 w  2 d MATERNAL FINDINGS: Cervix:  Appears closed. Uterus/Adnexae: No abnormality visualized. IMPRESSION: Single live intrauterine gestation of 28 weeks 2 days. This exam is performed on an emergent basis and does not comprehensively evaluate fetal size, dating, or anatomy; follow-up complete OB US should be considered if further fetal assessment is warranted. Electronically Signed   By: Lupita Raider M.D.   On: 06/22/2020 13:34   US Venous Img Lower Bilateral (DVT)  Result Date: 06/22/2020 CLINICAL DATA:  Elevated D-dimer, [redacted] weeks pregnant, COVID-19 positive EXAM: BILATERAL LOWER EXTREMITY VENOUS DOPPLER ULTRASOUND TECHNIQUE: Gray-scale sonography with compression, as well as color and duplex ultrasound, were performed to evaluate the deep venous system(s) from the level of the common femoral vein through the popliteal and proximal calf veins. COMPARISON:  None. FINDINGS: VENOUS Normal compressibility of the common femoral, superficial femoral, and popliteal veins, as well as the visualized calf veins. Visualized portions of profunda femoral vein and great saphenous vein unremarkable. No filling defects to suggest DVT on grayscale or color Doppler imaging. Doppler waveforms show normal direction of venous flow, normal respiratory plasticity and response to augmentation. OTHER None. Limitations: none IMPRESSION: 1. No evidence of deep venous  thrombosis within either lower extremity. Electronically Signed   By: Sharlet Salina M.D.   On: 06/22/2020 21:41     Assessment / Plan:   Darlene Kelley is a 24 y.o. [id number] at [redacted]w[redacted]d who presents with covid sx. General guidelines for inpatient recommendations are below. Monoclonal antibodies are reasonable in pregnancy.  1. Monitor respiratory status and maintain oxygen saturation > 95% with supplemental oxygen as needed 2. Airborne and Droplet Precautions 3. Continuous pulse oximetry 4. If remains inpatient, repeat labs labs daily: CBC, CMP, CRP, Ddimer, Ferritin. Repeat CXR as indicated. 5. Fetal Dopplers once per shift is reasonable at 28 weeks. Obstetric ultrasound if vaginal bleeding. Fetal heartrate monitoring with NST machine if decreased fetal movement noted. 6. Lovenox [place] daily for VTE prophylaxis; heparin is fine 7. Keep patient in lateral decubitus (to relieve uterine pressure on  major vessels) if the patient is not intubated.  Prone patient positioning recommended for patients with high oxygen needs or mechanical ventilation.  Incentive spirometry recommended. 8. For worsening disease, Dexamethasone can be safely administered for the duration of illness without significant fetal adverse outcomes. If needed, there is no known fetal toxicity associated with Remdesivir. If patient's status deteriorates, no intensive care management would be withheld because of her pregnancy status. Most drugs are safe in pregnancy and the ICU team may discuss with our team if patient needs intensive care.  Will need antenatal testing in third trimester as an outpatient.  Thank you for allowing [place] to be involved in this patient's care. Will sign off

## 2020-06-23 DIAGNOSIS — U071 COVID-19: Secondary | ICD-10-CM | POA: Diagnosis not present

## 2020-06-23 LAB — CBC
HCT: 30.5 % — ABNORMAL LOW (ref 36.0–46.0)
HCT: 30 % — ABNORMAL LOW (ref 36.0–46.0)
Hemoglobin: 10.2 g/dL — ABNORMAL LOW (ref 12.0–15.0)
Hemoglobin: 10.4 g/dL — ABNORMAL LOW (ref 12.0–15.0)
MCH: 30.3 pg (ref 26.0–34.0)
MCH: 31 pg (ref 26.0–34.0)
MCHC: 33.4 g/dL (ref 30.0–36.0)
MCHC: 34.7 g/dL (ref 30.0–36.0)
MCV: 90.5 fL (ref 80.0–100.0)
MCV: 89.3 fL (ref 80.0–100.0)
Platelets: 156 10*3/uL (ref 150–400)
Platelets: 163 10*3/uL (ref 150–400)
RBC: 3.37 MIL/uL — ABNORMAL LOW (ref 3.87–5.11)
RBC: 3.36 MIL/uL — ABNORMAL LOW (ref 3.87–5.11)
RDW: 12.8 % (ref 11.5–15.5)
RDW: 12.8 % (ref 11.5–15.5)
WBC: 7.3 10*3/uL (ref 4.0–10.5)
WBC: 5.9 10*3/uL (ref 4.0–10.5)
nRBC: 0 % (ref 0.0–0.2)
nRBC: 0 % (ref 0.0–0.2)

## 2020-06-23 LAB — COMPREHENSIVE METABOLIC PANEL
ALT: 13 U/L (ref 0–44)
AST: 20 U/L (ref 15–41)
Albumin: 2.4 g/dL — ABNORMAL LOW (ref 3.5–5.0)
Alkaline Phosphatase: 68 U/L (ref 38–126)
Anion gap: 6 (ref 5–15)
BUN: 5 mg/dL — ABNORMAL LOW (ref 6–20)
CO2: 22 mmol/L (ref 22–32)
Calcium: 8.6 mg/dL — ABNORMAL LOW (ref 8.9–10.3)
Chloride: 104 mmol/L (ref 98–111)
Creatinine, Ser: 0.34 mg/dL — ABNORMAL LOW (ref 0.44–1.00)
GFR, Estimated: >60 mL/min (ref >60)
Glucose, Bld: 105 mg/dL — ABNORMAL HIGH (ref 70–99)
Potassium: 3.5 mmol/L (ref 3.5–5.1)
Sodium: 132 mmol/L — ABNORMAL LOW (ref 135–145)
Total Bilirubin: 0.8 mg/dL (ref 0.3–1.2)
Total Protein: 5.5 g/dL — ABNORMAL LOW (ref 6.5–8.1)

## 2020-06-23 LAB — TSH: TSH: 0.312 u[IU]/mL — ABNORMAL LOW (ref 0.350–4.500)

## 2020-06-23 LAB — TRIGLYCERIDES: Triglycerides: 93 mg/dL (ref <150)

## 2020-06-23 LAB — HIV ANTIBODY (ROUTINE TESTING W REFLEX): HIV Screen 4th Generation wRfx: NONREACTIVE

## 2020-06-23 LAB — CREATININE, SERUM
Creatinine, Ser: 0.41 mg/dL — ABNORMAL LOW (ref 0.44–1.00)
GFR, Estimated: >60 mL/min (ref >60)

## 2020-06-23 LAB — HEMOGLOBIN A1C
Hgb A1c MFr Bld: 4.4 % — ABNORMAL LOW (ref 4.8–5.6)
Mean Plasma Glucose: 79.58 mg/dL

## 2020-06-23 LAB — C-REACTIVE PROTEIN: CRP: 1.4 mg/dL — ABNORMAL HIGH (ref <1.0)

## 2020-06-23 MED ORDER — CEPHALEXIN 500 MG PO CAPS: 500.0000 mg | ORAL_CAPSULE | Freq: Four times a day (QID) | ORAL | 0 refills | Status: AC

## 2020-06-23 MED ORDER — EPINEPHRINE 0.3 MG/0.3ML IJ SOAJ: 0.3000 mg | Freq: Once | INTRAMUSCULAR | Status: DC | PRN

## 2020-06-23 MED ORDER — ALBUTEROL SULFATE HFA 108 (90 BASE) MCG/ACT IN AERS
2.0000 | INHALATION_SPRAY | Freq: Four times a day (QID) | RESPIRATORY_TRACT | Status: DC
  Filled 2020-06-23: qty 6.7

## 2020-06-23 MED ORDER — ALBUTEROL SULFATE HFA 108 (90 BASE) MCG/ACT IN AERS
2.0000 | INHALATION_SPRAY | Freq: Once | RESPIRATORY_TRACT | Status: DC | PRN
  Filled 2020-06-23: qty 6.7

## 2020-06-23 MED ORDER — SODIUM CHLORIDE 0.9 % IV SOLN: Freq: Once | INTRAVENOUS | Status: DC

## 2020-06-23 MED ORDER — FAMOTIDINE IN NACL 20-0.9 MG/50ML-% IV SOLN: 20.0000 mg | Freq: Once | INTRAVENOUS | Status: DC | PRN

## 2020-06-23 MED ORDER — METHYLPREDNISOLONE SODIUM SUCC 125 MG IJ SOLR: 125.0000 mg | Freq: Once | INTRAMUSCULAR | Status: DC | PRN

## 2020-06-23 MED ORDER — SODIUM CHLORIDE 0.9 % IV SOLN: INTRAVENOUS | Status: DC | PRN

## 2020-06-23 MED ORDER — ALBUTEROL SULFATE HFA 108 (90 BASE) MCG/ACT IN AERS: 2.0000 | INHALATION_SPRAY | Freq: Four times a day (QID) | RESPIRATORY_TRACT | 0 refills | Status: DC | PRN

## 2020-06-23 MED ORDER — DIPHENHYDRAMINE HCL 50 MG/ML IJ SOLN: 50.0000 mg | Freq: Once | INTRAMUSCULAR | Status: DC | PRN

## 2020-06-23 NOTE — ED Notes (Signed)
Pt assisted to bedside toilet at this time. Pt back to bed, no needs noted. Will continue to monitor.

## 2020-06-23 NOTE — Discharge Instructions (Signed)
COVID-19: How to Protect Yourself and Others Know how it spreads  There is currently no vaccine to prevent coronavirus disease 2019 (COVID-19).  The best way to prevent illness is to avoid being exposed to this virus.  The virus is thought to spread mainly from person-to-person. ? Between people who are in close contact with one another (within about 6 feet). ? Through respiratory droplets produced when an infected person coughs, sneezes or talks. ? These droplets can land in the mouths or noses of people who are nearby or possibly be inhaled into the lungs. ? COVID-19 may be spread by people who are not showing symptoms. Everyone should Clean your hands often  Wash your hands often with soap and water for at least 20 seconds especially after you have been in a public place, or after blowing your nose, coughing, or sneezing.  If soap and water are not readily available, use a hand sanitizer that contains at least 60% alcohol. Cover all surfaces of your hands and rub them together until they feel dry.  Avoid touching your eyes, nose, and mouth with unwashed hands. Avoid close contact  Limit contact with others as much as possible.  Avoid close contact with people who are sick.  Put distance between yourself and other people. ? Remember that some people without symptoms may be able to spread virus. ? This is especially important for people who are at higher risk of getting very GainPain.com.cy Cover your mouth and nose with a mask when around others  You could spread COVID-19 to others even if you do not feel sick.  Everyone should wear a mask in public settings and when around people not living in their household, especially when social distancing is difficult to maintain. ? Masks should not be placed on young children under age 37, anyone who has trouble breathing, or is unconscious, incapacitated or otherwise  unable to remove the mask without assistance.  The mask is meant to protect other people in case you are infected.  Do NOT use a facemask meant for a Dietitian.  Continue to keep about 6 feet between yourself and others. The mask is not a substitute for social distancing. Cover coughs and sneezes  Always cover your mouth and nose with a tissue when you cough or sneeze or use the inside of your elbow.  Throw used tissues in the trash.  Immediately wash your hands with soap and water for at least 20 seconds. If soap and water are not readily available, clean your hands with a hand sanitizer that contains at least 60% alcohol. Clean and disinfect  Clean AND disinfect frequently touched surfaces daily. This includes tables, doorknobs, light switches, countertops, handles, desks, phones, keyboards, toilets, faucets, and sinks. RackRewards.[email or web address]  If surfaces are dirty, clean them: Use detergent or soap and water prior to disinfection.  Then, use a household disinfectant. You can see a list of EPA-registered household disinfectants here. [email or web address].[email or web address] 02/26/2019 This information is not intended to replace advice given to you by your health care provider. Make sure you discuss any questions you have with your health care provider. Document Revised: 03/06/2019 Document Reviewed: 01/02/2019 Elsevier Patient Education  [place] Can Do to Manage Your COVID-19 Symptoms at Home If you have possible or confirmed COVID-19: 1. Stay home from work and school. And stay away from other public places. If you must go out, avoid using any kind of public transportation, ridesharing, or taxis. 2. Monitor your symptoms  carefully. If your symptoms get worse, call your healthcare provider immediately. 3. Get rest and stay hydrated. 4. If you have a medical appointment, call the healthcare provider ahead of  time and tell them that you have or may have COVID-19. 5. For medical emergencies, call 911 and notify the dispatch personnel that you have or may have COVID-19. 6. Cover your cough and sneezes with a tissue or use the inside of your elbow. 7. Wash your hands often with soap and water for at least 20 seconds or clean your hands with an alcohol-based hand sanitizer that contains at least 60% alcohol. 8. As much as possible, stay in a specific room and away from other people in your home. Also, you should use a separate bathroom, if available. If you need to be around other people in or outside of the home, wear a mask. 9. Avoid sharing personal items with other people in your household, like dishes, towels, and bedding. 10. Clean all surfaces that are touched often, like counters, tabletops, and doorknobs. Use household cleaning sprays or wipes according to the label instructions. SouthAmericaFlowers.co.ukcdc.gov/coronavirus 12/25/2018 This information is not intended to replace advice given to you by your health care provider. Make sure you discuss any questions you have with your health care provider. Document Revised: 05/29/2019 Document Reviewed: 05/29/2019 Elsevier Patient Education  2020 Elsevier Inc.   COVID-19 COVID-19 is a respiratory infection that is caused by a virus called severe acute respiratory syndrome coronavirus 2 (SARS-CoV-2). The disease is also known as coronavirus disease or novel coronavirus. In some people, the virus may not cause any symptoms. In others, it may cause a serious infection. The infection can get worse quickly and can lead to complications, such as:  Pneumonia, or infection of the lungs.  Acute respiratory distress syndrome or ARDS. This is a condition in which fluid build-up in the lungs prevents the lungs from filling with air and passing oxygen into the blood.  Acute respiratory failure. This is a condition in which there is not enough oxygen passing from the lungs to the body  or when carbon dioxide is not passing from the lungs out of the body.  Sepsis or septic shock. This is a serious bodily reaction to an infection.  Blood clotting problems.  Secondary infections due to bacteria or fungus.  Organ failure. This is when your body's organs stop working. The virus that causes COVID-19 is contagious. This means that it can spread from person to person through droplets from coughs and sneezes (respiratory secretions). What are the causes? This illness is caused by a virus. You may catch the virus by:  Breathing in droplets from an infected person. Droplets can be spread by a person breathing, speaking, singing, coughing, or sneezing.  Touching something, like a table or a doorknob, that was exposed to the virus (contaminated) and then touching your mouth, nose, or eyes. What increases the risk? Risk for infection You are more likely to be infected with this virus if you:  Are within 6 feet (2 meters) of a person with COVID-19.  Provide care for or live with a person who is infected with COVID-19.  Spend time in crowded indoor spaces or live in shared housing. Risk for serious illness You are more likely to become seriously ill from the virus if you:  Are 26 years of age or older. The higher your age, the more you are at risk for serious illness.  Live in a nursing home or long-term care  facility.  Have cancer.  Have a long-term (chronic) disease such as: ? Chronic lung disease, including chronic obstructive pulmonary disease or asthma. ? A long-term disease that lowers your body's ability to fight infection (immunocompromised). ? Heart disease, including heart failure, a condition in which the arteries that lead to the heart become narrow or blocked (coronary artery disease), a disease which makes the heart muscle thick, weak, or stiff (cardiomyopathy). ? Diabetes. ? Chronic kidney disease. ? Sickle cell disease, a condition in which red blood cells  have an abnormal "sickle" shape. ? Liver disease.  Are obese. What are the signs or symptoms? Symptoms of this condition can range from mild to severe. Symptoms may appear any time from 2 to 14 days after being exposed to the virus. They include:  A fever or chills.  A cough.  Difficulty breathing.  Headaches, body aches, or muscle aches.  Runny or stuffy (congested) nose.  A sore throat.  New loss of taste or smell. Some people may also have stomach problems, such as nausea, vomiting, or diarrhea. Other people may not have any symptoms of COVID-19. How is this diagnosed? This condition may be diagnosed based on:  Your signs and symptoms, especially if: ? You live in an area with a COVID-19 outbreak. ? You recently traveled to or from an area where the virus is common. ? You provide care for or live with a person who was diagnosed with COVID-19. ? You were exposed to a person who was diagnosed with COVID-19.  A physical exam.  Lab tests, which may include: ? Taking a sample of fluid from the back of your nose and throat (nasopharyngeal fluid), your nose, or your throat using a swab. ? A sample of mucus from your lungs (sputum). ? Blood tests.  Imaging tests, which may include, X-rays, CT scan, or ultrasound. How is this treated? At present, there is no medicine to treat COVID-19. Medicines that treat other diseases are being used on a trial basis to see if they are effective against COVID-19. Your health care provider will talk with you about ways to treat your symptoms. For most people, the infection is mild and can be managed at home with rest, fluids, and over-the-counter medicines. Treatment for a serious infection usually takes places in a hospital intensive care unit (ICU). It may include one or more of the following treatments. These treatments are given until your symptoms improve.  Receiving fluids and medicines through an IV.  Supplemental oxygen. Extra oxygen  is given through a tube in the nose, a face mask, or a hood.  Positioning you to lie on your stomach (prone position). This makes it easier for oxygen to get into the lungs.  Continuous positive airway pressure (CPAP) or bi-level positive airway pressure (BPAP) machine. This treatment uses mild air pressure to keep the airways open. A tube that is connected to a motor delivers oxygen to the body.  Ventilator. This treatment moves air into and out of the lungs by using a tube that is placed in your windpipe.  Tracheostomy. This is a procedure to create a hole in the neck so that a breathing tube can be inserted.  Extracorporeal membrane oxygenation (ECMO). This procedure gives the lungs a chance to recover by taking over the functions of the heart and lungs. It supplies oxygen to the body and removes carbon dioxide. Follow these instructions at home: Lifestyle  If you are sick, stay home except to get medical care. Your health  care provider will tell you how long to stay home. Call your health care provider before you go for medical care.  Rest at home as told by your health care provider.  Do not use any products that contain nicotine or tobacco, such as cigarettes, e-cigarettes, and chewing tobacco. If you need help quitting, ask your health care provider.  Return to your normal activities as told by your health care provider. Ask your health care provider what activities are safe for you. General instructions  Take over-the-counter and prescription medicines only as told by your health care provider.  Drink enough fluid to keep your urine pale yellow.  Keep all follow-up visits as told by your health care provider. This is important. How is this prevented?  There is no vaccine to help prevent COVID-19 infection. However, there are steps you can take to protect yourself and others from this virus. To protect yourself:   Do not travel to areas where COVID-19 is a risk. The areas  where COVID-19 is reported change often. To identify high-risk areas and travel restrictions, check the CDC travel website: FatFares.com.br  If you live in, or must travel to, an area where COVID-19 is a risk, take precautions to avoid infection. ? Stay away from people who are sick. ? Wash your hands often with soap and water for 20 seconds. If soap and water are not available, use an alcohol-based hand sanitizer. ? Avoid touching your mouth, face, eyes, or nose. ? Avoid going out in public, follow guidance from your state and local health authorities. ? If you must go out in public, wear a cloth face covering or face mask. Make sure your mask covers your nose and mouth. ? Avoid crowded indoor spaces. Stay at least 6 feet (2 meters) away from others. ? Disinfect objects and surfaces that are frequently touched every day. This may include:  Counters and tables.  Doorknobs and light switches.  Sinks and faucets.  Electronics, such as phones, remote controls, keyboards, computers, and tablets. To protect others: If you have symptoms of COVID-19, take steps to prevent the virus from spreading to others.  If you think you have a COVID-19 infection, contact your health care provider right away. Tell your health care team that you think you may have a COVID-19 infection.  Stay home. Leave your house only to seek medical care. Do not use public transport.  Do not travel while you are sick.  Wash your hands often with soap and water for 20 seconds. If soap and water are not available, use alcohol-based hand sanitizer.  Stay away from other members of your household. Let healthy household members care for children and pets, if possible. If you have to care for children or pets, wash your hands often and wear a mask. If possible, stay in your own room, separate from others. Use a different bathroom.  Make sure that all people in your household wash their hands well and  often.  Cough or sneeze into a tissue or your sleeve or elbow. Do not cough or sneeze into your hand or into the air.  Wear a cloth face covering or face mask. Make sure your mask covers your nose and mouth. Where to find more information  Centers for Disease Control and Prevention: PurpleGadgets.be  World Health Organization: https://www.castaneda.info/ Contact a health care provider if:  You live in or have traveled to an area where COVID-19 is a risk and you have symptoms of the infection.  You have  had contact with someone who has COVID-19 and you have symptoms of the infection. Get help right away if:  You have trouble breathing.  You have pain or pressure in your chest.  You have confusion.  You have bluish lips and fingernails.  You have difficulty waking from sleep.  You have symptoms that get worse. These symptoms may represent a serious problem that is an emergency. Do not wait to see if the symptoms will go away. Get medical help right away. Call your local emergency services (911 in the [place] Do not drive yourself to the hospital. Let the emergency medical personnel know if you think you have COVID-19. Summary  COVID-19 is a respiratory infection that is caused by a virus. It is also known as coronavirus disease or novel coronavirus. It can cause serious infections, such as pneumonia, acute respiratory distress syndrome, acute respiratory failure, or sepsis.  The virus that causes COVID-19 is contagious. This means that it can spread from person to person through droplets from breathing, speaking, singing, coughing, or sneezing.  You are more likely to develop a serious illness if you are 2 years of age or older, have a weak immune system, live in a nursing home, or have chronic disease.  There is no medicine to treat COVID-19. Your health care provider will talk with you about ways to treat your symptoms.  Take steps to  protect yourself and others from infection. Wash your hands often and disinfect objects and surfaces that are frequently touched every day. Stay away from people who are sick and wear a mask if you are sick. This information is not intended to replace advice given to you by your health care provider. Make sure you discuss any questions you have with your health care provider. Document Revised: 04/11/2019 Document Reviewed: 07/18/2018 Elsevier Patient Education  2020 Elsevier Inc.    Person Under Monitoring Name: Darlene Kelley  Location: [address] [place] [address]   Infection Prevention Recommendations for Individuals Confirmed to have, or Being Evaluated for, 2019 Novel Coronavirus (COVID-19) Infection Who Receive Care at Home  Individuals who are confirmed to have, or are being evaluated for, COVID-19 should follow the prevention steps below until a healthcare provider or local or state health department says they can return to normal activities.  Stay home except to get medical care You should restrict activities outside your home, except for getting medical care. Do not go to work, school, or public areas, and do not use public transportation or taxis.  Call ahead before visiting your doctor Before your medical appointment, call the healthcare provider and tell them that you have, or are being evaluated for, COVID-19 infection. This will help the healthcare provider's office take steps to keep other people from getting infected. Ask your healthcare provider to call the local or state health department.  Monitor your symptoms Seek prompt medical attention if your illness is worsening (e.g., difficulty breathing). Before going to your medical appointment, call the healthcare provider and tell them that you have, or are being evaluated for, COVID-19 infection. Ask your healthcare provider to call the local or state health department.  Wear a facemask You  should wear a facemask that covers your nose and mouth when you are in the same room with other people and when you visit a healthcare provider. People who live with or visit you should also wear a facemask while they are in the same room with you.  Separate yourself from other people in your home As much as possible, you should stay in a different room from other people in your home. Also, you should use a separate bathroom, if available.  Avoid sharing household items You should not share dishes, drinking glasses, cups, eating utensils, towels, bedding, or other items with other people in your home. After using these items, you should wash them thoroughly with soap and water.  Cover your coughs and sneezes Cover your mouth and nose with a tissue when you cough or sneeze, or you can cough or sneeze into your sleeve. Throw used tissues in a lined trash can, and immediately wash your hands with soap and water for at least 20 seconds or use an alcohol-based hand rub.  Wash your [employer] your hands often and thoroughly with soap and water for at least 20 seconds. You can use an alcohol-based hand sanitizer if soap and water are not available and if your hands are not visibly dirty. Avoid touching your eyes, nose, and mouth with unwashed hands.   Prevention Steps for Caregivers and Household Members of Individuals Confirmed to have, or Being Evaluated for, COVID-19 Infection Being Cared for in the Home  If you live with, or provide care at home for, a person confirmed to have, or being evaluated for, COVID-19 infection please follow these guidelines to prevent infection:  Follow healthcare provider's instructions Make sure that you understand and can help the patient follow any healthcare provider instructions for all care.  Provide for the patient's basic needs You should help the patient with basic needs in the home and provide support for getting groceries, prescriptions, and other  personal needs.  Monitor the patient's symptoms If they are getting sicker, call his or her medical provider and tell them that the patient has, or is being evaluated for, COVID-19 infection. This will help the healthcare provider's office take steps to keep other people from getting infected. Ask the healthcare provider to call the local or state health department.  Limit the number of people who have contact with the patient  If possible, have only one caregiver for the patient.  Other household members should stay in another home or place of residence. If this is not possible, they should stay  in another room, or be separated from the patient as much as possible. Use a separate bathroom, if available.  Restrict visitors who do not have an essential need to be in the home.  Keep older adults, very young children, and other sick people away from the patient Keep older adults, very young children, and those who have compromised immune systems or chronic health conditions away from the patient. This includes people with chronic heart, lung, or kidney conditions, diabetes, and cancer.  Ensure good ventilation Make sure that shared spaces in the home have good air flow, such as from an air conditioner or an opened window, weather permitting.  Wash your hands often  Wash your hands often and thoroughly with soap and water for at least 20 seconds. You can use an alcohol based hand sanitizer if soap and water are not available and if your hands are not visibly dirty.  Avoid touching your eyes, nose, and mouth with unwashed hands.  Use disposable paper towels to dry your hands. If not available, use dedicated cloth towels and replace them when they become wet.  Wear a facemask and gloves  Wear a disposable facemask at all times in the room and gloves when you  touch or have contact with the patient's blood, body fluids, and/or secretions or excretions, such as sweat, saliva, sputum, nasal  mucus, vomit, urine, or feces.  Ensure the mask fits over your nose and mouth tightly, and do not touch it during use.  Throw out disposable facemasks and gloves after using them. Do not reuse.  Wash your hands immediately after removing your facemask and gloves.  If your personal clothing becomes contaminated, carefully remove clothing and launder. Wash your hands after handling contaminated clothing.  Place all used disposable facemasks, gloves, and other waste in a lined container before disposing them with other household waste.  Remove gloves and wash your hands immediately after handling these items.  Do not share dishes, glasses, or other household items with the patient  Avoid sharing household items. You should not share dishes, drinking glasses, cups, eating utensils, towels, bedding, or other items with a patient who is confirmed to have, or being evaluated for, COVID-19 infection.  After the person uses these items, you should wash them thoroughly with soap and water.  Wash laundry thoroughly  Immediately remove and wash clothes or bedding that have blood, body fluids, and/or secretions or excretions, such as sweat, saliva, sputum, nasal mucus, vomit, urine, or feces, on them.  Wear gloves when handling laundry from the patient.  Read and follow directions on labels of laundry or clothing items and detergent. In general, wash and dry with the warmest temperatures recommended on the label.  Clean all areas the individual has used often  Clean all touchable surfaces, such as counters, tabletops, doorknobs, bathroom fixtures, toilets, phones, keyboards, tablets, and bedside tables, every day. Also, clean any surfaces that may have blood, body fluids, and/or secretions or excretions on them.  Wear gloves when cleaning surfaces the patient has come in contact with.  Use a diluted bleach solution (e.g., dilute bleach with 1 part bleach and 10 parts water) or a household  disinfectant with a label that says EPA-registered for coronaviruses. To make a bleach solution at home, add 1 tablespoon of bleach to 1 quart (4 cups) of water. For a larger supply, add  cup of bleach to 1 gallon (16 cups) of water.  Read labels of cleaning products and follow recommendations provided on product labels. Labels contain instructions for safe and effective use of the cleaning product including precautions you should take when applying the product, such as wearing gloves or eye protection and making sure you have good ventilation during use of the product.  Remove gloves and wash hands immediately after cleaning.  Monitor yourself for signs and symptoms of illness Caregivers and household members are considered close contacts, should monitor their health, and will be asked to limit movement outside of the home to the extent possible. Follow the monitoring steps for close contacts listed on the symptom monitoring form.   ? If you have additional questions, contact your local health department or call the epidemiologist on call at [phone] (available 24/7). ? This guidance is subject to change. For the most up-to-date guidance from [facility], please refer to their website: [email or web address]

## 2020-06-23 NOTE — Discharge Summary (Signed)
Physician Discharge Summary  Darlene MowKesha C Diguglielmo WGN:[id number] DOB: [birth year] DOA: 06/22/2020  PCP: Emogene MorganAycock, Ngwe A, MD  Admit date: 06/22/2020 Discharge date: 06/23/2020  Admitted From: Home Disposition: Home   Recommendations for Outpatient Follow-up:  1. Follow up with PCP in 1-2 weeks 2. Follow-up with OB/GYN as scheduled 3. Albuterol MDI as needed for shortness of breath/wheezing 4. Keflex 500 mg p.o. every 6 hours to complete 7-day course for UTI 5. Please assess oxygen saturation at next visit 6. Please follow up on the following pending results: Urine Culture  Home Health: No Equipment/Devices: Home pulse oximeter  Discharge Condition: Stable CODE STATUS: Full code Diet recommendation: Regular diet  History of present illness:  Darlene Kelley is a 24 y.o. female with no significant medical history who presents to ED with one day history of fever/chills/ muscle aches /sore throat with persistent nausea and vomiting. Patient noted her child have has mild uri recently. She currently denies sob, chest pain, palpitations, presyncope, diarrhea, lost of sense of taste or smell. She notes she feel generally very weak. She is currently around [redacted] weeks pregnant. She notes that baby is moving as usually no abdominal pain, no abnormal bleeding.   Hospital course:  Acute Covid-19 viral infection during the ongoing 2020/2021 Covid 19 Pandemic - POA Patient presenting to the ED for fever/chills, body aches and nausea and vomiting.  Covid-19 PCR positive 06/23/2020.  Patient was seen by GYN and cleared for monoclonal antibody, remdesivir, and steroids if indicated.  Tempted to give monoclonal antibody infusion while inpatient, unable given none available currently per pharmacy.  Given patient is tolerating oral intake without any further nausea and vomiting and no hypoxia, will discharge home for outpatient follow-up with her PCP.  Message monoclonal antibody infusion center regarding placement on  the list for infusion once available, they will contact patient within 24-48 hours.  Continue albuterol MDI as needed.  Continue home isolation per CDC guidelines.  The treatment plan and use of medications and known side effects were discussed with patient/family. Some of the medications used are based on case reports/anecdotal data.  All other medications being used in the management of COVID-19 based on limited study data.  Complete risks and long-term side effects are unknown, however in the best clinical judgment they seem to be of some benefit.  Patient wanted to proceed with treatment options provided.  Urinary tract infection Stress, negative nitrite, rare bacteria.  Patient was started on Keflex 5 mg p.o. every 6 hours and will continue to complete a 7-day course.  Intrauterine pregnancy Outpatient follow-up with OB.  Discharge Diagnoses:  Active Problems:   COVID-19    Discharge Instructions  Discharge Instructions    Call MD for:  difficulty breathing, headache or visual disturbances   Complete by: As directed    Call MD for:  extreme fatigue   Complete by: As directed    Call MD for:  persistant dizziness or light-headedness   Complete by: As directed    Call MD for:  persistant nausea and vomiting   Complete by: As directed    Call MD for:  temperature >100.4   Complete by: As directed    Diet - low sodium heart healthy   Complete by: As directed    Increase activity slowly   Complete by: As directed      Allergies as of 06/23/2020   No Known Allergies     Medication List    TAKE these medications   albuterol  108 (90 Base) MCG/ACT inhaler Commonly known as: VENTOLIN HFA Inhale 2 puffs into the lungs every 6 (six) hours as needed for wheezing or shortness of breath.   cephALEXin 500 MG capsule Commonly known as: KEFLEX Take 1 capsule (500 mg total) by mouth every 6 (six) hours for 6 days.            Durable Medical Equipment  (From admission,  onward)         Start     Ordered   06/23/20 1020  For home use only DME Pulse oximeter  Once        06/23/20 1019          Follow-up Information    Aycock, Ngwe A, MD. Schedule an appointment as soon as possible for a visit in 1 week(s).   Specialty: Family Medicine Contact information: [address] [place] [address] [phone]              No Known Allergies  Consultations:  OB/GYN, Dr. Dalbert Garnet   Procedures/Studies: DG Chest 1 View  Result Date: 06/22/2020 CLINICAL DATA:  Tachycardia. EXAM: CHEST  1 VIEW COMPARISON:  06/24/2019 FINDINGS: The heart size and mediastinal contours are within normal limits. Both lungs are clear. The visualized skeletal structures are unremarkable. IMPRESSION: No active disease. Electronically Signed   By: Marlan Palau M.D.   On: 06/22/2020 11:52   US OB Limited  Result Date: 06/22/2020 CLINICAL DATA:  Dehydration. EXAM: LIMITED OBSTETRIC ULTRASOUND FINDINGS: Number of Fetuses: 1 Heart Rate:  171 bpm Movement: Yes Presentation: Breech Placental Location: Posterior Previa: No Amniotic Fluid (Subjective):  Within normal limits. AFI: 14.2 cm BPD: 7.1 cm 28 w  2 d MATERNAL FINDINGS: Cervix:  Appears closed. Uterus/Adnexae: No abnormality visualized. IMPRESSION: Single live intrauterine gestation of 28 weeks 2 days. This exam is performed on an emergent basis and does not comprehensively evaluate fetal size, dating, or anatomy; follow-up complete OB US should be considered if further fetal assessment is warranted. Electronically Signed   By: Lupita Raider M.D.   On: 06/22/2020 13:34   US Venous Img Lower Bilateral (DVT)  Result Date: 06/22/2020 CLINICAL DATA:  Elevated D-dimer, [redacted] weeks pregnant, COVID-19 positive EXAM: BILATERAL LOWER EXTREMITY VENOUS DOPPLER ULTRASOUND TECHNIQUE: Gray-scale sonography with compression, as well as color and duplex ultrasound, were performed to evaluate the deep venous system(s) from the  level of the common femoral vein through the popliteal and proximal calf veins. COMPARISON:  None. FINDINGS: VENOUS Normal compressibility of the common femoral, superficial femoral, and popliteal veins, as well as the visualized calf veins. Visualized portions of profunda femoral vein and great saphenous vein unremarkable. No filling defects to suggest DVT on grayscale or color Doppler imaging. Doppler waveforms show normal direction of venous flow, normal respiratory plasticity and response to augmentation. OTHER None. Limitations: none IMPRESSION: 1. No evidence of deep venous thrombosis within either lower extremity. Electronically Signed   By: Sharlet Salina M.D.   On: 06/22/2020 21:41      Subjective: Patient seen and examined at bedside, resting comfortably.  Like something more to eat this morning.  No further nausea/vomiting.  Oxygenating well on room air, discussed with patient monoclonal antibiotic fusion and referral to outpatient infusion center since my home antibiotic is unavailable currently.  No other questions or concerns at this time.  Denies headache, no current fever/chills/night sweats, no nausea/vomiting/diarrhea, no chest pain, no palpitations, no shortness of breath, no abdominal pain, no weakness, no  fatigue, no paresthesias.  No acute events overnight per nursing staff.  Discharge Exam: Vitals:   06/23/20 0400 06/23/20 0943  BP: 100/67 (!) 109/95  Pulse: (!) 109 (!) 101  Resp: (!) 22 13  Temp:  98.4 F (36.9 C)  SpO2: 100% 100%   Vitals:   06/23/20 0230 06/23/20 0300 06/23/20 0400 06/23/20 0943  BP:  (!) 82/51 100/67 (!) 109/95  Pulse: 95 98 (!) 109 (!) 101  Resp: 16 15 (!) 22 13  Temp:    98.4 F (36.9 C)  TempSrc:    Oral  SpO2: 99% 98% 100% 100%  Weight:      Height:        General: Pt is alert, awake, not in acute distress Cardiovascular: RRR, S1/S2 +, no rubs, no gallops Respiratory: CTA bilaterally, no wheezing, no rhonchi Abdominal: Soft, NT, ND,  bowel sounds + Extremities: no edema, no cyanosis    The results of significant diagnostics from this hospitalization (including imaging, microbiology, ancillary and laboratory) are listed below for reference.     Microbiology: Recent Results (from the past 240 hour(s))  Resp Panel by RT-PCR (Flu A&B, Covid) Nasopharyngeal Swab     Status: Abnormal   Collection Time: 06/22/20 10:54 AM   Specimen: Nasopharyngeal Swab; Nasopharyngeal(NP) swabs in vial transport medium  Result Value Ref Range Status   SARS Coronavirus 2 by RT PCR POSITIVE (A) NEGATIVE Final    Comment: RESULT CALLED TO, READ BACK BY AND VERIFIED WITH:  ALLY YOW AT 1310 06/22/20 SDR (NOTE) SARS-CoV-2 target nucleic acids are DETECTED.  The SARS-CoV-2 RNA is generally detectable in upper respiratory specimens during the acute phase of infection. Positive results are indicative of the presence of the identified virus, but do not rule out bacterial infection or co-infection with other pathogens not detected by the test. Clinical correlation with patient history and other diagnostic information is necessary to determine patient infection status. The expected result is Negative.  Fact Sheet for Patients: BloggerCourse.com  Fact Sheet for Healthcare Providers: SeriousBroker.it  This test is not yet approved or cleared by the [place] FDA and  has been authorized for detection and/or diagnosis of SARS-CoV-2 by FDA under an Emergency Use Authorization (EUA).  This EUA will remain in effect (meaning this test can be [place] ed) for the duration of  the COVID-19 declaration under Section 564(b)(1) of the Act, 21 U.S.C. section 360bbb-3(b)(1), unless the authorization is terminated or revoked sooner.     Influenza A by PCR NEGATIVE NEGATIVE Final   Influenza B by PCR NEGATIVE NEGATIVE Final    Comment: (NOTE) The Xpert Xpress SARS-CoV-2/FLU/RSV plus assay is intended as  an aid in the diagnosis of influenza from Nasopharyngeal swab specimens and should not be used as a sole basis for treatment. Nasal washings and aspirates are unacceptable for Xpert Xpress SARS-CoV-2/FLU/RSV testing.  Fact Sheet for Patients: BloggerCourse.com  Fact Sheet for Healthcare Providers: SeriousBroker.it  This test is not yet approved or cleared by the [place] FDA and has been authorized for detection and/or diagnosis of SARS-CoV-2 by FDA under an Emergency Use Authorization (EUA). This EUA will remain in effect (meaning this test can be used) for the duration of the COVID-19 declaration under Section 564(b)(1) of the Act, 21 U.S.C. section 360bbb-3(b)(1), unless the authorization is terminated or revoked.  Performed at [facility], [address] [place], [place] [address]   Group A Strep by PCR [facility] Only)     Status: None  Collection Time: 06/22/20 12:39 PM   Specimen: Throat; Sterile Swab  Result Value Ref Range Status   Group A Strep by PCR NOT DETECTED NOT DETECTED Final    Comment: Performed at [facility], [address] [place], [place] [address]  Blood Culture (routine x 2)     Status: None (Preliminary result)   Collection Time: 06/22/20  6:57 PM   Specimen: BLOOD  Result Value Ref Range Status   Specimen Description BLOOD RIGHT ANTECUBITAL  Final   Special Requests   Final    BOTTLES DRAWN AEROBIC AND ANAEROBIC Blood Culture adequate volume   Culture   Final    NO GROWTH < 12 HOURS Performed at [facility], [address] [place], [place] [address]    Report Status PENDING  Incomplete  Blood Culture (routine x 2)     Status: None (Preliminary result)   Collection Time: 06/22/20  7:00 PM   Specimen: BLOOD  Result Value Ref Range Status   Specimen Description BLOOD BLOOD RIGHT FOREARM  Final   Special Requests   Final    BOTTLES DRAWN AEROBIC AND ANAEROBIC  Blood Culture adequate volume   Culture   Final    NO GROWTH < 12 HOURS Performed at [facility], [address] [place], [place] [address]    Report Status PENDING  Incomplete     Labs: BNP (last 3 results) No results for input(s): BNP in the last 8760 hours. Basic Metabolic Panel: Recent Labs  Lab 06/22/20 1057 06/22/20 1858 06/23/20 0346  NA 131*  --  132*  K 3.4*  --  3.5  CL 103  --  104  CO2 20*  --  22  GLUCOSE 80  --  105*  BUN 7  --  5*  CREATININE 0.38* 0.41* 0.34*  CALCIUM 8.9  --  8.6*  MG 1.6*  --   --    Liver Function Tests: Recent Labs  Lab 06/22/20 1057 06/23/20 0346  AST 18 20  ALT 13 13  ALKPHOS 78 68  BILITOT 1.0 0.8  PROT 6.6 5.5*  ALBUMIN 2.8* 2.4*   Recent Labs  Lab 06/22/20 1057  LIPASE 22   No results for input(s): AMMONIA in the last 168 hours. CBC: Recent Labs  Lab 06/22/20 1057 06/22/20 2321 06/23/20 0346  WBC 9.3 7.3 5.9  HGB 11.7* 10.2* 10.4*  HCT 33.4* 30.5* 30.0*  MCV 86.8 90.5 89.3  PLT 159 156 163   Cardiac Enzymes: No results for input(s): CKTOTAL, CKMB, CKMBINDEX, TROPONINI in the last 168 hours. BNP: Invalid input(s): POCBNP CBG: No results for input(s): GLUCAP in the last 168 hours. D-Dimer No results for input(s): DDIMER in the last 72 hours. Hgb A1c No results for input(s): HGBA1C in the last 72 hours. Lipid Profile Recent Labs    06/22/20 2228  TRIG 93   Thyroid function studies Recent Labs    06/22/20 1858  TSH 0.312*   Anemia work up Recent Labs    06/22/20 1858  FERRITIN 17   Urinalysis    Component Value Date/Time   COLORURINE STRAW (A) 06/22/2020 1238   APPEARANCEUR CLEAR (A) 06/22/2020 1238   APPEARANCEUR Clear 11/29/2013 2046   LABSPEC 1.001 (L) 06/22/2020 1238   LABSPEC 1.021 11/29/2013 2046   PHURINE 7.0 06/22/2020 1238   GLUCOSEU >=500 (A) 06/22/2020 1238   GLUCOSEU Negative 11/29/2013 2046   HGBUR SMALL (A) 06/22/2020 1238   BILIRUBINUR NEGATIVE 06/22/2020  1238  BILIRUBINUR Negative 11/29/2013 2046   KETONESUR NEGATIVE 06/22/2020 1238   PROTEINUR NEGATIVE 06/22/2020 1238   NITRITE NEGATIVE 06/22/2020 1238   LEUKOCYTESUR NEGATIVE 06/22/2020 1238   LEUKOCYTESUR Negative 11/29/2013 2046   Sepsis Labs Invalid input(s): PROCALCITONIN,  WBC,  LACTICIDVEN Microbiology Recent Results (from the past 240 hour(s))  Resp Panel by RT-PCR (Flu A&B, Covid) Nasopharyngeal Swab     Status: Abnormal   Collection Time: 06/22/20 10:54 AM   Specimen: Nasopharyngeal Swab; Nasopharyngeal(NP) swabs in vial transport medium  Result Value Ref Range Status   SARS Coronavirus 2 by RT PCR POSITIVE (A) NEGATIVE Final    Comment: RESULT CALLED TO, READ BACK BY AND VERIFIED WITH:  ALLY YOW AT 1310 06/22/20 SDR (NOTE) SARS-CoV-2 target nucleic acids are DETECTED.  The SARS-CoV-2 RNA is generally detectable in upper respiratory specimens during the acute phase of infection. Positive results are indicative of the presence of the identified virus, but do not rule out bacterial infection or co-infection with other pathogens not detected by the test. Clinical correlation with patient history and other diagnostic information is necessary to determine patient infection status. The expected result is Negative.  Fact Sheet for Patients: BloggerCourse.com  Fact Sheet for Healthcare Providers: SeriousBroker.it  This test is not yet approved or cleared by the [place] FDA and  has been authorized for detection and/or diagnosis of SARS-CoV-2 by FDA under an Emergency Use Authorization (EUA).  This EUA will remain in effect (meaning this test can be [place] ed) for the duration of  the COVID-19 declaration under Section 564(b)(1) of the Act, 21 U.S.C. section 360bbb-3(b)(1), unless the authorization is terminated or revoked sooner.     Influenza A by PCR NEGATIVE NEGATIVE Final   Influenza B by PCR NEGATIVE NEGATIVE  Final    Comment: (NOTE) The Xpert Xpress SARS-CoV-2/FLU/RSV plus assay is intended as an aid in the diagnosis of influenza from Nasopharyngeal swab specimens and should not be used as a sole basis for treatment. Nasal washings and aspirates are unacceptable for Xpert Xpress SARS-CoV-2/FLU/RSV testing.  Fact Sheet for Patients: BloggerCourse.com  Fact Sheet for Healthcare Providers: SeriousBroker.it  This test is not yet approved or cleared by the [place] FDA and has been authorized for detection and/or diagnosis of SARS-CoV-2 by FDA under an Emergency Use Authorization (EUA). This EUA will remain in effect (meaning this test can be used) for the duration of the COVID-19 declaration under Section 564(b)(1) of the Act, 21 U.S.C. section 360bbb-3(b)(1), unless the authorization is terminated or revoked.  Performed at [facility], [address] [place], [place] [address]   Group A Strep by PCR [facility] Only)     Status: None   Collection Time: 06/22/20 12:39 PM   Specimen: Throat; Sterile Swab  Result Value Ref Range Status   Group A Strep by PCR NOT DETECTED NOT DETECTED Final    Comment: Performed at [facility], [address] [place], [place] [address]  Blood Culture (routine x 2)     Status: None (Preliminary result)   Collection Time: 06/22/20  6:57 PM   Specimen: BLOOD  Result Value Ref Range Status   Specimen Description BLOOD RIGHT ANTECUBITAL  Final   Special Requests   Final    BOTTLES DRAWN AEROBIC AND ANAEROBIC Blood Culture adequate volume   Culture   Final    NO GROWTH < 12 HOURS Performed at [facility], [address] [place], [place] [address]    Report Status PENDING  Incomplete  Blood Culture (routine x 2)     Status: None (Preliminary result)   Collection Time: 06/22/20  7:00 PM   Specimen: BLOOD  Result Value Ref Range Status   Specimen Description BLOOD BLOOD  RIGHT FOREARM  Final   Special Requests   Final    BOTTLES DRAWN AEROBIC AND ANAEROBIC Blood Culture adequate volume   Culture   Final    NO GROWTH < 12 HOURS Performed at [facility], [address], [place], [place] [address]    Report Status PENDING  Incomplete     Time coordinating discharge: Over 30 minutes  SIGNED:   Alvira Philips [place], DO  [facility] 06/23/2020, 10:19 AM

## 2020-06-23 NOTE — ED Notes (Signed)
Pt up for discharge. Awaiting pulse ox from social work. Social work notified

## 2020-06-23 NOTE — ED Notes (Signed)
Admit MD at bedside

## 2020-06-23 NOTE — ED Notes (Signed)
Pt ambulated to bedside toilet at this time with this RN's assistance. Pt tolerated well, only needed assistance due to weakness. Pt back in bed, new warm blankets given. Pt requesting food, this RN gave patient sandwich tray, crackers, and sprite. No further needs noted. Will continue to monitor.

## 2020-06-24 LAB — URINE CULTURE: Culture: NO GROWTH

## 2020-06-26 NOTE — L&D Delivery Note (Signed)
Delivery Note  Date of delivery: 08/25/2020 Estimated Date of Delivery: 09/11/20 No LMP recorded. Patient is pregnant. EGA: [redacted]w[redacted]d  Delivery Note At 12:47 PM a viable female was delivered via Vaginal, Spontaneous (Presentation: Left Occiput Anterior).  APGAR: 8, 9; weight 2360g, 5lb3.3oz.   Placenta status: Spontaneous, Intact.  Cord: 3 vessels with the following complications: None.  Cord pH: n/a  First Stage: Labor onset: 0100 Augmentation : Pitocin Analgesia Eliezer Lofts intrapartum: Epidural SROM at 0100  Darlene Kelley presented to L&D with SROM. She was augmented with pitocin. Epidural placed.   Second Stage: Complete dilation at 1202 Onset of pushing at 1240 FHR second stage Cat II Delivery at 1247 on 08/25/2020  She progressed to complete and had a spontaneous vaginal birth of a live female over an intact perineum. The fetal head was delivered in OA position with restitution to LOA. Loose nuchal cord, reduced over fetal head. Anterior then posterior shoulders delivered spontaneously. Baby placed on mom's abdomen and attended to by transition RN. Cord clamped and cut when pulseless by FOB. Cord blood obtained for newborn labs.  Third Stage: Placenta delivered intact with 3VC at 1254 Placenta disposition: Pathology Uterine tone firm / bleeding min IV pitocin given for hemorrhage prophylaxis  Anesthesia: Epidural Episiotomy: None Lacerations: None Suture Repair: n/a Est. Blood Loss (mL):  100  Complications: none  Mom to postpartum.  Baby to Couplet care / Skin to Skin.  Newborn: Birth Weight: 2360g   5lb3.3oz  Apgar Scores: 8, 9 Feeding planned: Breastfeeding   Darlene Kelley, CNM 08/25/2020 1:02 PM

## 2020-06-27 LAB — CULTURE, BLOOD (ROUTINE X 2)
Culture: NO GROWTH
Culture: NO GROWTH
Special Requests: ADEQUATE
Special Requests: ADEQUATE

## 2020-08-06 ENCOUNTER — Other Ambulatory Visit: Payer: Self-pay

## 2020-08-06 ENCOUNTER — Observation Stay
Admission: EM | Admit: 2020-08-06 | Discharge: 2020-08-06 | Disposition: A | Discharge status: Home / Self Care | Payer: Medicaid Other | Attending: Obstetrics and Gynecology | Admitting: Obstetrics and Gynecology

## 2020-08-06 ENCOUNTER — Observation Stay: Payer: Medicaid Other

## 2020-08-06 DIAGNOSIS — O360130 Maternal care for anti-D [Rh] antibodies, third trimester, not applicable or unspecified: Secondary | ICD-10-CM | POA: Insufficient documentation

## 2020-08-06 DIAGNOSIS — O99013 Anemia complicating pregnancy, third trimester: Secondary | ICD-10-CM | POA: Diagnosis not present

## 2020-08-06 DIAGNOSIS — Z3A34 34 weeks gestation of pregnancy: Secondary | ICD-10-CM | POA: Insufficient documentation

## 2020-08-06 DIAGNOSIS — O0993 Supervision of high risk pregnancy, unspecified, third trimester: Secondary | ICD-10-CM | POA: Insufficient documentation

## 2020-08-06 DIAGNOSIS — O219 Vomiting of pregnancy, unspecified: Secondary | ICD-10-CM | POA: Diagnosis present

## 2020-08-06 DIAGNOSIS — O99613 Diseases of the digestive system complicating pregnancy, third trimester: Secondary | ICD-10-CM | POA: Diagnosis not present

## 2020-08-06 LAB — COMPREHENSIVE METABOLIC PANEL
ALT: 12 U/L (ref 0–44)
AST: 17 U/L (ref 15–41)
Albumin: 2.8 g/dL — ABNORMAL LOW (ref 3.5–5.0)
Alkaline Phosphatase: 98 U/L (ref 38–126)
Anion gap: 10 (ref 5–15)
BUN: 10 mg/dL (ref 6–20)
CO2: 20 mmol/L — ABNORMAL LOW (ref 22–32)
Calcium: 9.4 mg/dL (ref 8.9–10.3)
Chloride: 102 mmol/L (ref 98–111)
Creatinine, Ser: 0.39 mg/dL — ABNORMAL LOW (ref 0.44–1.00)
GFR, Estimated: >60 mL/min (ref >60)
Glucose, Bld: 72 mg/dL (ref 70–99)
Potassium: 3.7 mmol/L (ref 3.5–5.1)
Sodium: 132 mmol/L — ABNORMAL LOW (ref 135–145)
Total Bilirubin: 2.2 mg/dL — ABNORMAL HIGH (ref 0.3–1.2)
Total Protein: 7 g/dL (ref 6.5–8.1)

## 2020-08-06 LAB — CBC
HCT: 33.1 % — ABNORMAL LOW (ref 36.0–46.0)
Hemoglobin: 11.9 g/dL — ABNORMAL LOW (ref 12.0–15.0)
MCH: 31.2 pg (ref 26.0–34.0)
MCHC: 36 g/dL (ref 30.0–36.0)
MCV: 86.6 fL (ref 80.0–100.0)
Platelets: 194 10*3/uL (ref 150–400)
RBC: 3.82 MIL/uL — ABNORMAL LOW (ref 3.87–5.11)
RDW: 13 % (ref 11.5–15.5)
WBC: 11.7 10*3/uL — ABNORMAL HIGH (ref 4.0–10.5)
nRBC: 0 % (ref 0.0–0.2)

## 2020-08-06 LAB — URINALYSIS, COMPLETE (UACMP) WITH MICROSCOPIC
Bilirubin Urine: NEGATIVE
Glucose, UA: NEGATIVE mg/dL
Ketones, ur: 80 mg/dL — AB
Nitrite: NEGATIVE
Protein, ur: NEGATIVE mg/dL
Specific Gravity, Urine: 1.025 (ref 1.005–1.030)
pH: 5 (ref 5.0–8.0)

## 2020-08-06 LAB — PROTEIN / CREATININE RATIO, URINE
Creatinine, Urine: 193 mg/dL
Protein Creatinine Ratio: 0.09 mg/mg{Cre} (ref 0.00–0.15)
Total Protein, Urine: 17 mg/dL

## 2020-08-06 MED ORDER — LACTATED RINGERS IV BOLUS
1000.0000 mL | Freq: Once | INTRAVENOUS | Status: AC
  Administered 2020-08-06: 1000 mL via INTRAVENOUS

## 2020-08-06 MED ORDER — ACETAMINOPHEN 325 MG PO TABS: 650.0000 mg | ORAL_TABLET | ORAL | Status: AC | PRN

## 2020-08-06 MED ORDER — ONDANSETRON 4 MG PO TBDP
4.0000 mg | ORAL_TABLET | Freq: Four times a day (QID) | ORAL | Status: DC | PRN
  Administered 2020-08-06: 4 mg via ORAL
  Filled 2020-08-06: qty 1

## 2020-08-06 MED ORDER — ACETAMINOPHEN 325 MG PO TABS: 650.0000 mg | ORAL_TABLET | ORAL | Status: DC | PRN

## 2020-08-06 MED ORDER — PANTOPRAZOLE SODIUM 40 MG PO TBEC: 40.0000 mg | DELAYED_RELEASE_TABLET | Freq: Every day | ORAL | 0 refills | Status: DC

## 2020-08-06 MED ORDER — LOPERAMIDE HCL 2 MG PO CAPS: 2.0000 mg | ORAL_CAPSULE | Freq: Four times a day (QID) | ORAL | 0 refills | Status: DC | PRN

## 2020-08-06 MED ORDER — LOPERAMIDE HCL 2 MG PO CAPS
4.0000 mg | ORAL_CAPSULE | Freq: Once | ORAL | Status: DC
  Filled 2020-08-06: qty 2

## 2020-08-06 MED ORDER — ONDANSETRON HCL 4 MG/2ML IJ SOLN
4.0000 mg | Freq: Four times a day (QID) | INTRAMUSCULAR | Status: DC | PRN
  Administered 2020-08-06: 4 mg via INTRAVENOUS
  Filled 2020-08-06: qty 2

## 2020-08-06 MED ORDER — ONDANSETRON 4 MG PO TBDP: 4.0000 mg | ORAL_TABLET | Freq: Four times a day (QID) | ORAL | 0 refills | Status: DC | PRN

## 2020-08-06 MED ORDER — PANTOPRAZOLE SODIUM 40 MG PO TBEC
40.0000 mg | DELAYED_RELEASE_TABLET | Freq: Every day | ORAL | Status: DC
  Administered 2020-08-06: 40 mg via ORAL
  Filled 2020-08-06: qty 1

## 2020-08-06 MED ORDER — LACTATED RINGERS IV SOLN: INTRAVENOUS | Status: DC

## 2020-08-06 NOTE — Discharge Summary (Signed)
Darlene Kelley is a 25 y.o. female. She is at [redacted]w[redacted]d gestation. No LMP recorded. Patient is pregnant. Estimated Date of Delivery: 09/11/20  [facility] care site: [facility]   Current pregnancy complicated by:  1. Iron deficiency anemia 2. RH negative   Chief complaint: states that vomiting started at work about 1630 yesterday, along with diarrhea. Patient has not been able to keep down any liquids or solids. +FM. No bleeding or leaking of fluids. Patient states that she does have contractions about every 5 minutes.   S: Resting comfortably. no CTX, no VB.no LOF,  Active fetal movement. Denies: HA, visual changes, SOB, or RUQ/epigastric pain  Maternal Medical History:   Past Medical History:  Diagnosis Date  . GERD (gastroesophageal reflux disease)     Past Surgical History:  Procedure Laterality Date  . WISDOM TOOTH EXTRACTION      No Known Allergies  Prior to Admission medications   Medication Sig Start Date End Date Taking? Authorizing Provider  ferrous fumarate-iron polysaccharide complex (TANDEM) 162-115.2 MG CAPS capsule Take 1 capsule by mouth daily with breakfast.   Yes [provider]  omeprazole (PRILOSEC) 10 MG capsule Take 10 mg by mouth daily.   Yes [provider]  acetaminophen (TYLENOL) 325 MG tablet Take 2 tablets (650 mg total) by mouth every 4 (four) hours as needed (for pain scale < 4  OR  temperature  >/=  100.5 F). 08/06/20   Kaliel Bolds, Lurena Joiner A, CNM  albuterol (VENTOLIN HFA) 108 (90 Base) MCG/ACT inhaler Inhale 2 puffs into the lungs every 6 (six) hours as needed for wheezing or shortness of breath. Patient not taking: Reported on 08/06/2020 06/23/20   [place], Alvira Philips, DO  loperamide (IMODIUM) 2 MG capsule Take 1 capsule (2 mg total) by mouth 4 (four) times daily as needed for diarrhea or loose stools. 08/06/20   Wendle Kina, Prudencio Pair, CNM  ondansetron (ZOFRAN-ODT) 4 MG disintegrating tablet Take 1 tablet (4 mg total) by mouth every 6 (six) hours as  needed for nausea or vomiting. 08/06/20   Marella Vanderpol, Prudencio Pair, CNM  pantoprazole (PROTONIX) 40 MG tablet Take 1 tablet (40 mg total) by mouth daily. 08/07/20   Haydan Mansouri, Prudencio Pair, CNM      Social History: She  reports that she has been smoking cigarettes. She has a 1.00 pack-year smoking history. She has never used smokeless tobacco. She reports that she does not drink alcohol and does not use drugs.  Family History: no history of gyn cancers  Review of Systems: A full review of systems was performed and negative except as noted in the HPI.     O:  BP 112/66 (BP Location: Left Arm)   Pulse (!) 111   Temp 99.4 F (37.4 C) (Oral)   Resp 18   Ht 5\' 6"  (1.676 m)   Wt 64.4 kg   BMI 22.92 kg/m  Results for orders placed or performed during the hospital encounter of 08/06/20 (from the past 48 hour(s))  Comprehensive metabolic panel   Collection Time: 08/06/20 10:35 AM  Result Value Ref Range   Sodium 132 (L) 135 - 145 mmol/L   Potassium 3.7 3.5 - 5.1 mmol/L   Chloride 102 98 - 111 mmol/L   CO2 20 (L) 22 - 32 mmol/L   Glucose, Bld 72 70 - 99 mg/dL   BUN 10 6 - 20 mg/dL   Creatinine, Ser 10/04/20 (L) 0.44 - 1.00 mg/dL   Calcium 9.4 8.9 - 2.12 mg/dL  Total Protein 7.0 6.5 - 8.1 g/dL   Albumin 2.8 (L) 3.5 - 5.0 g/dL   AST 17 15 - 41 U/L   ALT 12 0 - 44 U/L   Alkaline Phosphatase 98 38 - 126 U/L   Total Bilirubin 2.2 (H) 0.3 - 1.2 mg/dL   GFR, Estimated >70 >17 mL/min   Anion gap 10 5 - 15  CBC on admission   Collection Time: 08/06/20 10:35 AM  Result Value Ref Range   WBC 11.7 (H) 4.0 - 10.5 K/uL   RBC 3.82 (L) 3.87 - 5.11 MIL/uL   Hemoglobin 11.9 (L) 12.0 - 15.0 g/dL   HCT 79.3 (L) 90.3 - 00.9 %   MCV 86.6 80.0 - 100.0 fL   MCH 31.2 26.0 - 34.0 pg   MCHC 36.0 30.0 - 36.0 g/dL   RDW 23.3 00.7 - 62.2 %   Platelets 194 150 - 400 K/uL   nRBC 0.0 0.0 - 0.2 %  Urinalysis, Complete w Microscopic   Collection Time: 08/06/20 11:16 AM  Result Value Ref Range   Color, Urine AMBER (A)  YELLOW   APPearance HAZY (A) CLEAR   Specific Gravity, Urine 1.025 1.005 - 1.030   pH 5.0 5.0 - 8.0   Glucose, UA NEGATIVE NEGATIVE mg/dL   Hgb urine dipstick SMALL (A) NEGATIVE   Bilirubin Urine NEGATIVE NEGATIVE   Ketones, ur 80 (A) NEGATIVE mg/dL   Protein, ur NEGATIVE NEGATIVE mg/dL   Nitrite NEGATIVE NEGATIVE   Leukocytes,Ua TRACE (A) NEGATIVE   RBC / HPF 6-10 0 - 5 RBC/hpf   WBC, UA 6-10 0 - 5 WBC/hpf   Bacteria, UA FEW (A) NONE SEEN   Squamous Epithelial / LPF 6-10 0 - 5   Mucus PRESENT   Protein / creatinine ratio, urine   Collection Time: 08/06/20 11:16 AM  Result Value Ref Range   Creatinine, Urine 193 mg/dL   Total Protein, Urine 17 mg/dL   Protein Creatinine Ratio 0.09 0.00 - 0.15 mg/mg[Cre]     Constitutional: NAD, AAOx3  HE/ENT: extraocular movements grossly intact, moist mucous membranes CV: RRR PULM: nl respiratory effort, CTABL     Abd: gravid, non-tender, non-distended, soft      Ext: Non-tender, Nonedematous   Psych: mood appropriate, speech normal Pelvic:  Dilation: 1 Effacement (%): 50 Station: -3 Exam by:: L.Elks, RN   Fetal  monitoring: Cat I Appropriate for GA Baseline: 130 Variability: moderate Accelerations: present x >2 Decelerations absent  TOCO: irreg occasional UCs noted.    A/P: 25 y.o. [redacted]w[redacted]d here for antenatal surveillance for N/V/D  Principle Diagnosis:  High risk pregnancy in third trimester; suspected viral gastroenteritis   Preterm labor: not present.   Fetal Wellbeing: Reassuring Cat 1 tracing with Reactive NST    reviewed hydration, comfort measures, antiemetics and antidiarrheal prn.   RUQ [place] negative, no elevated LFTs but increased bilirubin level noted incidental finding. Advised bland diet, avoid spicy/fatty foods.   Add on hepatitis panel due to elevated bilirubin. Negative Hep B labs with prenatal panel in early preg.   D/c home stable, precautions reviewed, follow-up as scheduled.     Randa Ngo,  CNM 08/06/2020  5:27 PM

## 2020-08-06 NOTE — Discharge Summary (Signed)
Reviewed discharge instructions with patient. Patient verbalizes understanding of instructions. Patient discharged home, patient's mother picked her up to take her home.

## 2020-08-06 NOTE — Discharge Instructions (Signed)
Nausea and Vomiting, Adult Nausea is feeling sick to your stomach or feeling that you are about to throw up (vomit). Vomiting is when food in your stomach is thrown up and out of the mouth. Throwing up can make you feel weak. It can also make you lose too much water in your body (get dehydrated). If you lose too much water in your body, you may:  Feel tired.  Feel thirsty.  Have a dry mouth.  Have cracked lips.  Go pee (urinate) less often. Older adults and people with other diseases or a weak body defense system (immune system) are at higher risk for losing too much water in the body. If you feel sick to your stomach and you throw up, it is important to follow instructions from your doctor about how to take care of yourself. Follow these instructions at home: Watch your symptoms for any changes. Tell your doctor about them. Follow these instructions to care for yourself at home. Eating and drinking  Take an ORS (oral rehydration solution). This is a drink that is sold at pharmacies and stores.  Drink clear fluids in small amounts as you are able, such as: ? Water. ? Ice chips. ? Fruit juice that has water added (diluted fruit juice). ? Low-calorie sports drinks.  Eat bland, easy-to-digest foods in small amounts as you are able, such as: ? Bananas. ? Applesauce. ? Rice. ? Low-fat (lean) meats. ? Toast. ? Crackers.  Avoid drinking fluids that have a lot of sugar or caffeine in them. This includes energy drinks, sports drinks, and soda.  Avoid alcohol.  Avoid spicy or fatty foods.      General instructions  Take over-the-counter and prescription medicines only as told by your doctor.  Drink enough fluid to keep your pee (urine) pale yellow.  Wash your hands often with soap and water. If you cannot use soap and water, use hand sanitizer.  Make sure that all people in your home wash their hands well and often.  Rest at home while you get better.  Watch your condition  for any changes.  Take slow and deep breaths when you feel sick to your stomach.  Keep all follow-up visits as told by your doctor. This is important. Contact a doctor if:  Your symptoms get worse.  You have new symptoms.  You have a fever.  You cannot drink fluids without throwing up.  You feel sick to your stomach for more than 2 days.  You feel light-headed or dizzy.  You have a headache.  You have muscle cramps.  You have a rash.  You have pain while peeing. Get help right away if:  You have pain in your chest, neck, arm, or jaw.  You feel very weak or you pass out (faint).  You throw up again and again.  You have throw up that is bright red or looks like black coffee grounds.  You have bloody or black poop (stools) or poop that looks like tar.  You have a very bad headache, a stiff neck, or both.  You have very bad pain, cramping, or bloating in your belly (abdomen).  You have trouble breathing.  You are breathing very quickly.  Your heart is beating very quickly.  Your skin feels cold and clammy.  You feel confused.  You have signs of losing too much water in your body, such as: ? Dark pee, very little pee, or no pee. ? Cracked lips. ? Dry mouth. ? Sunken eyes. ?   Sleepiness. ? Weakness. These symptoms may be an emergency. Do not wait to see if the symptoms will go away. Get medical help right away. Call your local emergency services (911 in the [place] Do not drive yourself to the hospital. Summary  Nausea is feeling sick to your stomach or feeling that you are about to throw up (vomit). Vomiting is when food in your stomach is thrown up and out of the mouth.  Follow instructions from your doctor about eating and drinking to keep from losing too much water in your body.  Take over-the-counter and prescription medicines only as told by your doctor.  Contact your doctor if your symptoms get worse or you have new symptoms.  Keep all follow-up  visits as told by your doctor. This is important. This information is not intended to replace advice given to you by your health care provider. Make sure you discuss any questions you have with your health care provider. Document Revised: 10/04/2018 Document Reviewed: 11/20/2017 Elsevier Patient Education  2021 [employer]vier Inc.  

## 2020-08-06 NOTE — OB Triage Note (Signed)
Patient is 24yo Female, [redacted]w[redacted]d, G7P3 that presents with n/v/d. Patient states that vomiting started at work about 1630 yesterday, along with diarrhea. Patient has not been able to keep down any liquids or solids. +FM. No bleeding or leaking of fluids. Patient states that she does have contractions about every 5 minutes. Monitors applied and assessing. Patient rates pain 10 out of 10, no medications taken at home. Temp 99.0. VSS.

## 2020-08-25 ENCOUNTER — Inpatient Hospital Stay: Payer: Medicaid Other | Admitting: Anesthesiology

## 2020-08-25 ENCOUNTER — Encounter: Payer: Self-pay | Admitting: Obstetrics and Gynecology

## 2020-08-25 ENCOUNTER — Inpatient Hospital Stay
Admission: EM | Admit: 2020-08-25 | Discharge: 2020-08-26 | DRG: 807 | Disposition: A | Discharge status: Home / Self Care | Payer: Medicaid Other | Attending: Obstetrics and Gynecology | Admitting: Obstetrics and Gynecology

## 2020-08-25 ENCOUNTER — Other Ambulatory Visit: Payer: Self-pay

## 2020-08-25 DIAGNOSIS — O26893 Other specified pregnancy related conditions, third trimester: Secondary | ICD-10-CM | POA: Diagnosis present

## 2020-08-25 DIAGNOSIS — K219 Gastro-esophageal reflux disease without esophagitis: Secondary | ICD-10-CM | POA: Diagnosis present

## 2020-08-25 DIAGNOSIS — R030 Elevated blood-pressure reading, without diagnosis of hypertension: Secondary | ICD-10-CM | POA: Diagnosis present

## 2020-08-25 DIAGNOSIS — O9962 Diseases of the digestive system complicating childbirth: Secondary | ICD-10-CM | POA: Diagnosis present

## 2020-08-25 DIAGNOSIS — F17210 Nicotine dependence, cigarettes, uncomplicated: Secondary | ICD-10-CM | POA: Diagnosis present

## 2020-08-25 DIAGNOSIS — Z6791 Unspecified blood type, Rh negative: Secondary | ICD-10-CM | POA: Diagnosis not present

## 2020-08-25 DIAGNOSIS — O99824 Streptococcus B carrier state complicating childbirth: Secondary | ICD-10-CM | POA: Diagnosis present

## 2020-08-25 DIAGNOSIS — Z3A37 37 weeks gestation of pregnancy: Secondary | ICD-10-CM

## 2020-08-25 DIAGNOSIS — O479 False labor, unspecified: Secondary | ICD-10-CM | POA: Diagnosis present

## 2020-08-25 DIAGNOSIS — O4292 Full-term premature rupture of membranes, unspecified as to length of time between rupture and onset of labor: Secondary | ICD-10-CM | POA: Diagnosis present

## 2020-08-25 DIAGNOSIS — Z8616 Personal history of COVID-19: Secondary | ICD-10-CM

## 2020-08-25 DIAGNOSIS — O99334 Smoking (tobacco) complicating childbirth: Secondary | ICD-10-CM | POA: Diagnosis present

## 2020-08-25 LAB — TYPE AND SCREEN
ABO/RH(D): O NEG
Antibody Screen: POSITIVE

## 2020-08-25 LAB — COMPREHENSIVE METABOLIC PANEL
ALT: 15 U/L (ref 0–44)
AST: 17 U/L (ref 15–41)
Albumin: 2.6 g/dL — ABNORMAL LOW (ref 3.5–5.0)
Alkaline Phosphatase: 124 U/L (ref 38–126)
Anion gap: 6 (ref 5–15)
BUN: 11 mg/dL (ref 6–20)
CO2: 22 mmol/L (ref 22–32)
Calcium: 9 mg/dL (ref 8.9–10.3)
Chloride: 105 mmol/L (ref 98–111)
Creatinine, Ser: 0.5 mg/dL (ref 0.44–1.00)
GFR, Estimated: >60 mL/min (ref >60)
Glucose, Bld: 93 mg/dL (ref 70–99)
Potassium: 3.9 mmol/L (ref 3.5–5.1)
Sodium: 133 mmol/L — ABNORMAL LOW (ref 135–145)
Total Bilirubin: 0.6 mg/dL (ref 0.3–1.2)
Total Protein: 6.7 g/dL (ref 6.5–8.1)

## 2020-08-25 LAB — CBC
HCT: 32.1 % — ABNORMAL LOW (ref 36.0–46.0)
Hemoglobin: 11 g/dL — ABNORMAL LOW (ref 12.0–15.0)
MCH: 29.4 pg (ref 26.0–34.0)
MCHC: 34.3 g/dL (ref 30.0–36.0)
MCV: 85.8 fL (ref 80.0–100.0)
Platelets: 211 10*3/uL (ref 150–400)
RBC: 3.74 MIL/uL — ABNORMAL LOW (ref 3.87–5.11)
RDW: 12.7 % (ref 11.5–15.5)
WBC: 13.2 10*3/uL — ABNORMAL HIGH (ref 4.0–10.5)
nRBC: 0 % (ref 0.0–0.2)

## 2020-08-25 LAB — CBC WITH DIFFERENTIAL/PLATELET
Abs Immature Granulocytes: 0.1 10*3/uL — ABNORMAL HIGH (ref 0.00–0.07)
Basophils Absolute: 0 10*3/uL (ref 0.0–0.1)
Basophils Relative: 0 %
Eosinophils Absolute: 0.1 10*3/uL (ref 0.0–0.5)
Eosinophils Relative: 1 %
HCT: 32.3 % — ABNORMAL LOW (ref 36.0–46.0)
Hemoglobin: 11.1 g/dL — ABNORMAL LOW (ref 12.0–15.0)
Immature Granulocytes: 1 %
Lymphocytes Relative: 20 %
Lymphs Abs: 2.4 10*3/uL (ref 0.7–4.0)
MCH: 29.5 pg (ref 26.0–34.0)
MCHC: 34.4 g/dL (ref 30.0–36.0)
MCV: 85.9 fL (ref 80.0–100.0)
Monocytes Absolute: 1 10*3/uL (ref 0.1–1.0)
Monocytes Relative: 8 %
Neutro Abs: 8.4 10*3/uL — ABNORMAL HIGH (ref 1.7–7.7)
Neutrophils Relative %: 70 %
Platelets: 199 10*3/uL (ref 150–400)
RBC: 3.76 MIL/uL — ABNORMAL LOW (ref 3.87–5.11)
RDW: 12.6 % (ref 11.5–15.5)
WBC: 12 10*3/uL — ABNORMAL HIGH (ref 4.0–10.5)
nRBC: 0 % (ref 0.0–0.2)

## 2020-08-25 LAB — PROTEIN / CREATININE RATIO, URINE
Creatinine, Urine: 18 mg/dL
Total Protein, Urine: <6 mg/dL

## 2020-08-25 MED ORDER — OXYTOCIN 10 UNIT/ML IJ SOLN
INTRAMUSCULAR | Status: AC
  Filled 2020-08-25: qty 2

## 2020-08-25 MED ORDER — ONDANSETRON HCL 4 MG PO TABS: 4.0000 mg | ORAL_TABLET | ORAL | Status: DC | PRN

## 2020-08-25 MED ORDER — DOCUSATE SODIUM 100 MG PO CAPS
100.0000 mg | ORAL_CAPSULE | Freq: Two times a day (BID) | ORAL | Status: DC
  Administered 2020-08-26: 100 mg via ORAL
  Filled 2020-08-25: qty 1

## 2020-08-25 MED ORDER — BUTORPHANOL TARTRATE 1 MG/ML IJ SOLN: 1.0000 mg | INTRAMUSCULAR | Status: DC | PRN

## 2020-08-25 MED ORDER — MISOPROSTOL 200 MCG PO TABS
ORAL_TABLET | ORAL | Status: AC
  Filled 2020-08-25: qty 4

## 2020-08-25 MED ORDER — DIPHENHYDRAMINE HCL 25 MG PO CAPS: 25.0000 mg | ORAL_CAPSULE | Freq: Four times a day (QID) | ORAL | Status: DC | PRN

## 2020-08-25 MED ORDER — IBUPROFEN 600 MG PO TABS
600.0000 mg | ORAL_TABLET | Freq: Four times a day (QID) | ORAL | Status: DC
Start: 2020-08-25 — End: 2020-08-26
  Administered 2020-08-25 – 2020-08-26 (×4): 600 mg via ORAL
  Filled 2020-08-25 (×6): qty 1

## 2020-08-25 MED ORDER — PHENYLEPHRINE 40 MCG/ML (10ML) SYRINGE FOR IV PUSH (FOR BLOOD PRESSURE SUPPORT): 80.0000 ug | PREFILLED_SYRINGE | INTRAVENOUS | Status: DC | PRN

## 2020-08-25 MED ORDER — PRENATAL MULTIVITAMIN CH
1.0000 | ORAL_TABLET | Freq: Every day | ORAL | Status: DC
  Administered 2020-08-26: 1 via ORAL
  Filled 2020-08-25: qty 1

## 2020-08-25 MED ORDER — ACETAMINOPHEN 325 MG PO TABS
650.0000 mg | ORAL_TABLET | ORAL | Status: DC | PRN
  Administered 2020-08-25: 650 mg via ORAL
  Filled 2020-08-25: qty 2

## 2020-08-25 MED ORDER — FENTANYL 2.5 MCG/ML W/ROPIVACAINE 0.15% IN NS 100 ML EPIDURAL (ARMC)
12.0000 mL/h | EPIDURAL | Status: DC
  Administered 2020-08-25: 12 mL/h via EPIDURAL

## 2020-08-25 MED ORDER — EPHEDRINE 5 MG/ML INJ: 10.0000 mg | INTRAVENOUS | Status: DC | PRN

## 2020-08-25 MED ORDER — AMMONIA AROMATIC IN INHA
RESPIRATORY_TRACT | Status: AC
  Filled 2020-08-25: qty 10

## 2020-08-25 MED ORDER — OXYTOCIN BOLUS FROM INFUSION
333.0000 mL | Freq: Once | INTRAVENOUS | Status: AC
  Administered 2020-08-25: 333 mL via INTRAVENOUS

## 2020-08-25 MED ORDER — COCONUT OIL OIL: 1.0000 "application " | TOPICAL_OIL | Status: DC | PRN

## 2020-08-25 MED ORDER — ONDANSETRON HCL 4 MG/2ML IJ SOLN: 4.0000 mg | INTRAMUSCULAR | Status: DC | PRN

## 2020-08-25 MED ORDER — LACTATED RINGERS IV SOLN: INTRAVENOUS | Status: DC

## 2020-08-25 MED ORDER — OXYCODONE HCL 5 MG PO TABS: 10.0000 mg | ORAL_TABLET | ORAL | Status: DC | PRN

## 2020-08-25 MED ORDER — WITCH HAZEL-GLYCERIN EX PADS
1.0000 "application " | MEDICATED_PAD | CUTANEOUS | Status: DC | PRN
  Filled 2020-08-25: qty 100

## 2020-08-25 MED ORDER — LIDOCAINE HCL (PF) 1 % IJ SOLN
INTRAMUSCULAR | Status: AC
  Filled 2020-08-25: qty 30

## 2020-08-25 MED ORDER — ACETAMINOPHEN 325 MG PO TABS: 650.0000 mg | ORAL_TABLET | ORAL | Status: DC | PRN

## 2020-08-25 MED ORDER — TETANUS-DIPHTH-ACELL PERTUSSIS 5-2.5-18.5 LF-MCG/0.5 IM SUSY: 0.5000 mL | PREFILLED_SYRINGE | Freq: Once | INTRAMUSCULAR | Status: DC

## 2020-08-25 MED ORDER — FENTANYL 2.5 MCG/ML W/ROPIVACAINE 0.15% IN NS 100 ML EPIDURAL (ARMC)
EPIDURAL | Status: AC
  Filled 2020-08-25: qty 100

## 2020-08-25 MED ORDER — OXYCODONE HCL 5 MG PO TABS: 5.0000 mg | ORAL_TABLET | ORAL | Status: DC | PRN

## 2020-08-25 MED ORDER — SODIUM CHLORIDE 0.9 % IV SOLN
2.0000 g | INTRAVENOUS | Status: DC
  Administered 2020-08-25 (×2): 2 g via INTRAVENOUS
  Filled 2020-08-25 (×2): qty 2000

## 2020-08-25 MED ORDER — LACTATED RINGERS IV SOLN: 500.0000 mL | Freq: Once | INTRAVENOUS | Status: DC

## 2020-08-25 MED ORDER — OXYTOCIN-SODIUM CHLORIDE 30-0.9 UT/500ML-% IV SOLN
2.5000 [IU]/h | INTRAVENOUS | Status: DC
  Administered 2020-08-25 (×2): 2.5 [IU]/h via INTRAVENOUS

## 2020-08-25 MED ORDER — SOD CITRATE-CITRIC ACID 500-334 MG/5ML PO SOLN
30.0000 mL | Freq: Once | ORAL | Status: AC
  Administered 2020-08-25: 30 mL via ORAL
  Filled 2020-08-25: qty 15

## 2020-08-25 MED ORDER — FERROUS SULFATE 325 (65 FE) MG PO TABS
325.0000 mg | ORAL_TABLET | Freq: Two times a day (BID) | ORAL | Status: DC
  Administered 2020-08-26: 325 mg via ORAL
  Filled 2020-08-25: qty 1

## 2020-08-25 MED ORDER — OXYTOCIN-SODIUM CHLORIDE 30-0.9 UT/500ML-% IV SOLN
1.0000 m[IU]/min | INTRAVENOUS | Status: DC
  Administered 2020-08-25: 4 m[IU]/min via INTRAVENOUS
  Filled 2020-08-25: qty 1000

## 2020-08-25 MED ORDER — DIPHENHYDRAMINE HCL 50 MG/ML IJ SOLN: 12.5000 mg | INTRAMUSCULAR | Status: DC | PRN

## 2020-08-25 MED ORDER — LACTATED RINGERS IV SOLN
500.0000 mL | INTRAVENOUS | Status: DC | PRN
  Administered 2020-08-25: 500 mL via INTRAVENOUS

## 2020-08-25 MED ORDER — LIDOCAINE HCL (PF) 1 % IJ SOLN: 30.0000 mL | INTRAMUSCULAR | Status: DC | PRN

## 2020-08-25 MED ORDER — TERBUTALINE SULFATE 1 MG/ML IJ SOLN: 0.2500 mg | Freq: Once | INTRAMUSCULAR | Status: DC | PRN

## 2020-08-25 MED ORDER — BENZOCAINE-MENTHOL 20-0.5 % EX AERO
1.0000 "application " | INHALATION_SPRAY | CUTANEOUS | Status: DC | PRN
  Filled 2020-08-25: qty 56

## 2020-08-25 MED ORDER — SIMETHICONE 80 MG PO CHEW: 80.0000 mg | CHEWABLE_TABLET | ORAL | Status: DC | PRN

## 2020-08-25 MED ORDER — SOD CITRATE-CITRIC ACID 500-334 MG/5ML PO SOLN
30.0000 mL | ORAL | Status: DC | PRN
  Administered 2020-08-25 (×2): 30 mL via ORAL
  Filled 2020-08-25 (×2): qty 15

## 2020-08-25 MED ORDER — BUPIVACAINE HCL (PF) 0.25 % IJ SOLN
INTRAMUSCULAR | Status: DC | PRN
  Administered 2020-08-25: 3 mL via EPIDURAL
  Administered 2020-08-25: 5 mL via EPIDURAL

## 2020-08-25 MED ORDER — ONDANSETRON HCL 4 MG/2ML IJ SOLN: 4.0000 mg | Freq: Four times a day (QID) | INTRAMUSCULAR | Status: DC | PRN

## 2020-08-25 MED ORDER — DIBUCAINE (PERIANAL) 1 % EX OINT: 1.0000 "application " | TOPICAL_OINTMENT | CUTANEOUS | Status: DC | PRN

## 2020-08-25 MED ORDER — LIDOCAINE HCL (PF) 1 % IJ SOLN
INTRAMUSCULAR | Status: DC | PRN
  Administered 2020-08-25: 3 mL

## 2020-08-25 MED ORDER — LIDOCAINE-EPINEPHRINE (PF) 1.5 %-1:200000 IJ SOLN
INTRAMUSCULAR | Status: DC | PRN
  Administered 2020-08-25: 3 mL via EPIDURAL

## 2020-08-25 NOTE — Anesthesia Preprocedure Evaluation (Signed)
Anesthesia Evaluation  Patient identified by MRN, date of birth, ID band Patient awake    Reviewed: Allergy & Precautions, H&P , NPO status , Patient's Chart, lab work & pertinent test results  Airway Mallampati: II  TM Distance: >3 FB Neck ROM: full    Dental no notable dental hx.    Pulmonary Current Smoker and Patient abstained from smoking.,    Pulmonary exam normal        Cardiovascular negative cardio ROS Normal cardiovascular exam     Neuro/Psych    GI/Hepatic GERD  Medicated,  Endo/Other    Renal/GU      Musculoskeletal   Abdominal   Peds  Hematology   Anesthesia Other Findings   Reproductive/Obstetrics (+) Pregnancy                             Anesthesia Physical Anesthesia Plan  ASA: II  Anesthesia Plan: Epidural   Post-op Pain Management:    Induction:   PONV Risk Score and Plan:   Airway Management Planned:   Additional Equipment:   Intra-op Plan:   Post-operative Plan:   Informed Consent: I have reviewed the patients History and Physical, chart, labs and discussed the procedure including the risks, benefits and alternatives for the proposed anesthesia with the patient or authorized representative who has indicated his/her understanding and acceptance.     Dental Advisory Given  Plan Discussed with: Anesthesiologist and CRNA  Anesthesia Plan Comments:         Anesthesia Quick Evaluation

## 2020-08-25 NOTE — Discharge Instructions (Signed)

## 2020-08-25 NOTE — Anesthesia Procedure Notes (Signed)
Epidural Patient location during procedure: OB  Staffing Anesthesiologist: Corinda Gubler, MD Resident/CRNA: Jeanine Luz, CRNA Performed: resident/CRNA   Preanesthetic Checklist Completed: patient identified, IV checked, site marked, risks and benefits discussed, surgical consent, monitors and equipment checked, pre-op evaluation and timeout performed  Epidural Patient position: sitting Prep: ChloraPrep Patient monitoring: heart rate, continuous pulse ox and blood pressure Approach: midline Location: L3-L4 Injection technique: LOR saline  Needle:  Needle type: Tuohy  Needle gauge: 17 G Needle length: 9 cm and 9 Needle insertion depth: 6 cm Catheter type: closed end flexible Catheter size: 19 Gauge Catheter at skin depth: 11 cm Test dose: negative and 1.5% lidocaine with Epi 1:200 K  Assessment Events: blood not aspirated, injection not painful, no injection resistance, no paresthesia and negative IV test  Additional Notes 1 attempt Pt. Evaluated and documentation done after procedure finished. Patient identified. Risks/Benefits/Options discussed with patient including but not limited to bleeding, infection, nerve damage, paralysis, failed block, incomplete pain control, headache, blood pressure changes, nausea, vomiting, reactions to medication both or allergic, itching and postpartum back pain. Confirmed with bedside nurse the patient's most recent platelet count. Confirmed with patient that they are not currently taking any anticoagulation, have any bleeding history or any family history of bleeding disorders. Patient expressed understanding and wished to proceed. All questions were answered. Sterile technique was used throughout the entire procedure. Please see nursing notes for vital signs. Test dose was given through epidural catheter and negative prior to continuing to dose epidural or start infusion. Warning signs of high block given to the patient including shortness of  breath, tingling/numbness in hands, complete motor block, or any concerning symptoms with instructions to call for help. Patient was given instructions on fall risk and not to get out of bed. All questions and concerns addressed with instructions to call with any issues or inadequate analgesia.   Patient tolerated the insertion well without immediate complications.Reason for block:procedure for pain

## 2020-08-25 NOTE — Progress Notes (Signed)
Labor Progress Note  Darlene Kelley is a 25 y.o. [phone] at [redacted]w[redacted]d admitted for rupture of membranes  Subjective: Pt currently has epidural. She reports feeling pressure but is otherwise comfortable.   Objective: BP (!) 147/84   Pulse (!) 118   Temp 98 F (36.7 C) (Oral)   Resp 18   Ht 5\' 6"  (1.676 m)   Wt 65.8 kg   SpO2 100%   BMI 23.40 kg/m   Fetal Assessment: FHT:  FHR: 125 bpm, variability: moderate,  accelerations:  Present,  decelerations:  Present occasional earlies Category/reactivity:  Category I UC:  Regular at 1.5-3 mins SVE:    Dilation: 5cm   Effacement: 90%  Station:  0  Consistency: soft  Position: anterior  Membrane status:SROM at 0100 Amniotic color: Clear  Labs: Lab Results  Component Value Date   WBC 13.2 (H) 08/25/2020   HGB 11.0 (L) 08/25/2020   HCT 32.1 (L) 08/25/2020   MCV 85.8 08/25/2020   PLT 211 08/25/2020    Assessment / Plan: Induction of labor due to PROM,  progressing well on pitocin  Labor: Progressing on Pitocin 41mU  IUPC placed, well tolerated  Preeclampsia:  labs stable  08/25/20 1036 147/84Abnormal  08/25/20 1021 127/67  08/25/20 1006 134/68  08/25/20 1001 128/74  08/25/20 0956 138/77  08/25/20 0954 140/90  08/25/20 0951 129/97Abnormal  08/25/20 0949 137/85   Fetal Wellbeing:  Category I Pain Control:  Epidural I/D:  Afebrile, GBS unknown - treating, SROM'd x 11hr Anticipated MOD:  NSVD  10/25/20, CNM 08/25/2020, 12:11 PM

## 2020-08-25 NOTE — H&P (Signed)
Darlene Kelley is a 25 y.o. female presenting forSROM at 0100 this am .  EGA 37+4 [facility] 09/11/20 [facility] Pregnancy complicated by ; 1.Covid 06/22/20 2.tobacco use 1/2 cig / day 3. Rhogam neg 4. UTI 5. ? Bipolar d/o  6. RPR + 1:1 titer    3 prior uncomplicated SVD   OB History    Gravida  7   Para  3   Term  3   Preterm  0   AB  3   Living  3     SAB  0   IAB  3   Ectopic  0   Multiple  0   Live Births  3          Past Medical History:  Diagnosis Date  . GERD (gastroesophageal reflux disease)    Past Surgical History:  Procedure Laterality Date  . WISDOM TOOTH EXTRACTION     Family History: family history is not on file. Social History:  reports that she has been smoking cigarettes. She has a 1.00 pack-year smoking history. She has never used smokeless tobacco. She reports that she does not drink alcohol and does not use drugs.     Maternal Diabetes: No Genetic Screening: Normal AFP Maternal Ultrasounds/Referrals: Normal Fetal Ultrasounds or other Referrals:  None Maternal Substance Abuse:  Yes:  Type: Smoker Significant Maternal Medications:  None GERD rx Significant Maternal Lab Results:  Group B Strep positive Other Comments:  None  Review of Systems History Dilation: 2 Effacement (%): 50 Station: -2 Exam by:: Darlene Hunter, RN Blood pressure (!) 155/95, pulse (!) 103, temperature 98.3 F (36.8 C), temperature source Oral, resp. rate 18, height 5\' 6"  (1.676 m), weight 65.8 kg, unknown if currently breastfeeding. Exam Physical Exam   LUngs CTA  CV RRR cx : 1-2 / 50 /OOP  Bedside u/s performed with VTX presentation  EFM : FHR 140 + accels , Good variability , + no decels . Irregular CTX  Prenatal labs: ABO, Rh: --/--/PENDING (03/02 05-03-1992) Antibody: PENDING (03/02 0314) Rubella:  IMM/ Var Imm RPR:   + 1:1 titer HBsAg:   neg HIV: Non Reactive (12/28 1858)  GBS:   +  Assessment/Plan: srom 37+4 weeks  Elevated BP transient  , normal PIH labs-  continue to monitor VTX , but not engaged reassuring fetal monitoring  Start Pitocin augmentation  Ampicillin 2 gm q 4 hrs  IV for GBS prophylaxis    08-09-1980 Darlene Kelley 08/25/2020, 6:50 AM

## 2020-08-25 NOTE — Progress Notes (Signed)
Labor Progress Note  Darlene Kelley is a 25 y.o. [phone] at [redacted]w[redacted]d admitted for rupture of membranes  Subjective: Assumed care. Pt is sitting up in bed, comfortable.  She reports feeling UCs but not strong at this time. She is planning an epidural.  Objective: BP (!) 139/93 (BP Location: Left Arm)   Pulse 90   Temp 98.4 F (36.9 C) (Oral)   Resp 18   Ht 5\' 6"  (1.676 m)   Wt 65.8 kg   BMI 23.40 kg/m   Fetal Assessment: FHT:  FHR: 130 bpm, variability: moderate,  accelerations:  Present,  decelerations:  Absent Category/reactivity:  Category I UC:   irregular SVE:   By at [phone] Dilation: 2cm  Effacement: 75%  Station:  -3  Consistency: soft  Position: middle  Membrane status:SROM at 0100 Amniotic color: Clear  Labs: Lab Results  Component Value Date   WBC 13.2 (H) 08/25/2020   HGB 11.0 (L) 08/25/2020   HCT 32.1 (L) 08/25/2020   MCV 85.8 08/25/2020   PLT 211 08/25/2020    Assessment / Plan: Induction of labor due to PROM,  progressing well on pitocin  Labor: Progressing on Pitocin 83mU TJS ordered to increase by 4u Preeclampsia:  labs stable  08/25/20 0715 139/93Abnormal  08/25/20 0523 --  08/25/20 0238 155/95Abnormal  08/25/20 0231 128/80  08/25/20 0229 128/80  08/25/20 0221 145/82Abnormal    Fetal Wellbeing:  Category I Pain Control:  Labor support without medications I/D:  Afebrile, GBS unknown - treating, SROM'd x 8hr Anticipated MOD:  NSVD  10/25/20, CNM 08/25/2020, 8:43 AM

## 2020-08-25 NOTE — Discharge Summary (Signed)
Obstetrical Discharge Summary  Patient Name: Darlene Kelley DOB: Apr 24, 1996 MRN: [id number]  Date of Admission: 08/25/2020 Date of Delivery: 08/25/20 Delivered by: Anselm Pancoast Date of Discharge: 08/26/2020  Primary OB: [facility]  LMP:No LMP recorded. EDC Estimated Date of Delivery: 09/11/20 Gestational Age at Delivery: [redacted]w[redacted]d   Antepartum complications:  1.Covid 06/22/20 2.tobacco use 1/2 cig / day 3. Rhogam neg 4. UTI 5. ? Bipolar d/o  6. RPR + 1:1 titer   Admitting Diagnosis: SROM Secondary Diagnosis: Patient Active Problem List   Diagnosis Date Noted  . NSVD (normal spontaneous vaginal delivery) 08/25/2020  . COVID-19 06/22/2020  . Encounter for supervision of other normal pregnancy 05/28/2016    Augmentation: Pitocin Complications: None  Intrapartum complications/course:  Delivery Type: spontaneous vaginal delivery Anesthesia: epidural Placenta: spontaneous Laceration: none Episiotomy: none Newborn Data: Live born female  Birth Weight:  2360g, 5lb3.3oz.  APGAR: 8, 9 "[name]" Newborn Delivery   Birth date/time: 08/25/2020 12:47:00 Delivery type: Vaginal, Spontaneous      25yo G4P1021 at 39+4wks presenting with LOF, SROM with clear fluid.  She progressed to complete and pushed over an intact perineum and delivered the fetal head, followed promptly by the shoulders. She was in control the whole time, and the baby placed on the maternal abdomen. Delayed cord clamping and the FOB cut the baby's cord, while the baby was skin to skin. The placenta delivered spontaneously and intact. No lacerations. Mom and baby tolerated the procedure well.   Postpartum Procedures: None  Post partum course:  Patient had an uncomplicated postpartum course.  By time of discharge on PPD#1, her pain was controlled on oral pain medications; she had appropriate lochia and was ambulating, voiding without difficulty and tolerating regular diet.  She was deemed stable for discharge to home.     Discharge Physical Exam:  BP 121/79 (BP Location: Right Arm)   Pulse 91   Temp 98.5 F (36.9 C)   Resp 18   Ht 5\' 6"  (1.676 m)   Wt 65.8 kg   SpO2 100%   Breastfeeding Unknown   BMI 23.40 kg/m   General: alert and no distress Pulm: normal respiratory effort Lochia: appropriate Abdomen: soft, NT Uterine Fundus: firm, below umbilicus Perineum: minimal edema/intact  Extremities: No evidence of DVT seen on physical exam. No lower extremity edema. Edinburgh:  Edinburgh Postnatal Depression Scale Screening Tool 08/25/2020 08/25/2020  I have been able to laugh and see the funny side of things. 0 (No Data)  I have looked forward with enjoyment to things. 0 -  I have blamed myself unnecessarily when things went wrong. 1 -  I have been anxious or worried for no good reason. 1 -  I have felt scared or panicky for no good reason. 1 -  Things have been getting on top of me. 2 -  I have been so unhappy that I have had difficulty sleeping. 1 -  I have felt sad or miserable. 1 -  I have been so unhappy that I have been crying. 1 -  The thought of harming myself has occurred to me. 0 -  Edinburgh Postnatal Depression Scale Total 8 -     Labs: CBC Latest Ref Rng & Units 08/26/2020 08/25/2020 08/25/2020  WBC 4.0 - 10.5 K/uL 12.6(H) 13.2(H) 12.0(H)  Hemoglobin 12.0 - 15.0 g/dL 11.1(L) 11.0(L) 11.1(L)  Hematocrit 36.0 - 46.0 % 32.6(L) 32.1(L) 32.3(L)  Platelets 150 - 400 K/uL 169 211 199   O NEG Hemoglobin  Date Value Ref  Range Status  08/26/2020 11.1 (L) 12.0 - 15.0 g/dL Final   HGB  Date Value Ref Range Status  02/26/2014 10.4 (L) 12.0 - 16.0 g/dL Final   HCT  Date Value Ref Range Status  08/26/2020 32.6 (L) 36.0 - 46.0 % Final  02/24/2014 36.1 35.0 - 47.0 % Final    Disposition: stable, discharge to home Baby Feeding: breastmilk Baby Disposition: home with mom  Contraception: Nexplanon   Prenatal Labs:  Blood type/Rh O neg  Antibody screen POS  Rubella Immune  Varicella  Immune  RPR 1:1 T.Palldum neg  HBsAg Neg  HIV NR  GC   Chlamydia   Genetic screening   1 hour GTT   3 hour GTT   GBS Pos   Rh Immune globulin given: Rhogam needed Rubella vaccine given: Immne Varicella vaccine given: Immune Tdap vaccine given in AP or PP setting: unknown Flu vaccine given in AP or PP setting: Unknown  Plan: Darlene Kelley was discharged to home in good condition. Follow-up appointment with delivering provider in 6 weeks. May follow up with [facility] for postpartum visit if preferred.   Discharge Instructions: Per After Visit Summary. Activity: Advance as tolerated. Pelvic rest for 6 weeks.   Diet: Regular Discharge Medications: Allergies as of 08/26/2020   No Known Allergies     Medication List    STOP taking these medications   albuterol 108 (90 Base) MCG/ACT inhaler Commonly known as: VENTOLIN HFA   loperamide 2 MG capsule Commonly known as: IMODIUM   ondansetron 4 MG disintegrating tablet Commonly known as: ZOFRAN-ODT     TAKE these medications   acetaminophen 325 MG tablet Commonly known as: TYLENOL Take 2 tablets (650 mg total) by mouth every 4 (four) hours as needed (for pain scale < 4  OR  temperature  >/=  100.5 F).   ferrous fumarate-iron polysaccharide complex 162-115.2 MG Caps capsule Commonly known as: TANDEM Take 1 capsule by mouth daily with breakfast.   ibuprofen 600 MG tablet Commonly known as: ADVIL Take 1 tablet (600 mg total) by mouth every 6 (six) hours as needed.   pantoprazole 40 MG tablet Commonly known as: PROTONIX Take 1 tablet (40 mg total) by mouth daily.      Outpatient follow up:   Follow-up Information    Haroldine Laws, CNM Follow up in 6 week(s).   Specialty: Certified Nurse Midwife Why: postpartum visit.  May follow up with [facility] if preferred.  Please let your follow up provider know you would like a Nexplanon at you postpartum visit.  Contact information: [address] [place]  [address] [phone]               Signed:  Margaretmary Eddy, CNM Certified Nurse Midwife [facility]

## 2020-08-25 NOTE — OB Triage Note (Signed)
Pt Darlene Kelley 25 y.o. presents to the ED complaining of leaking of fluid. Pt is a [id number] at [redacted]w[redacted]d . Pt denies active vaginal bleeding. Pt feels contractions rating 5/10 and states positive fetal movement. External FM and TOCO applied to non-tender abdomen and assessing. Initial FHR 135. Vital signs obtained and within normal limits. Provider notified of pt.

## 2020-08-26 LAB — CBC
HCT: 32.6 % — ABNORMAL LOW (ref 36.0–46.0)
Hemoglobin: 11.1 g/dL — ABNORMAL LOW (ref 12.0–15.0)
MCH: 29.4 pg (ref 26.0–34.0)
MCHC: 34 g/dL (ref 30.0–36.0)
MCV: 86.2 fL (ref 80.0–100.0)
Platelets: 169 10*3/uL (ref 150–400)
RBC: 3.78 MIL/uL — ABNORMAL LOW (ref 3.87–5.11)
RDW: 12.6 % (ref 11.5–15.5)
WBC: 12.6 10*3/uL — ABNORMAL HIGH (ref 4.0–10.5)
nRBC: 0 % (ref 0.0–0.2)

## 2020-08-26 LAB — FETAL SCREEN: Fetal Screen: NEGATIVE

## 2020-08-26 LAB — SURGICAL PATHOLOGY

## 2020-08-26 LAB — RPR: RPR Ser Ql: NONREACTIVE

## 2020-08-26 MED ORDER — IBUPROFEN 600 MG PO TABS: 600.0000 mg | ORAL_TABLET | Freq: Four times a day (QID) | ORAL | 0 refills | Status: DC | PRN

## 2020-08-26 MED ORDER — RHO D IMMUNE GLOBULIN 1500 UNIT/2ML IJ SOSY
300.0000 ug | PREFILLED_SYRINGE | Freq: Once | INTRAMUSCULAR | Status: AC
  Administered 2020-08-26: 300 ug via INTRAVENOUS
  Filled 2020-08-26: qty 2

## 2020-08-26 NOTE — Anesthesia Postprocedure Evaluation (Signed)
Anesthesia Post Note  Patient: Darlene Kelley  Procedure(s) Performed: AN AD HOC LABOR EPIDURAL  Patient location during evaluation: Mother Baby Anesthesia Type: Epidural Level of consciousness: awake and alert Pain management: pain level controlled Vital Signs Assessment: post-procedure vital signs reviewed and stable Respiratory status: spontaneous breathing, nonlabored ventilation and respiratory function stable Cardiovascular status: stable Postop Assessment: no headache, no backache and epidural receding Anesthetic complications: no   No complications documented.   Last Vitals:  Vitals:   08/26/20 0357 08/26/20 0758  BP: 125/90 120/84  Pulse: 98 89  Resp: 18 20  Temp: 36.7 C 36.8 C  SpO2: 97% 100%    Last Pain:  Vitals:   08/26/20 0758  TempSrc: Oral  PainSc:                  Karoline Caldwell

## 2020-08-26 NOTE — Progress Notes (Signed)
Post Partum Day 1  Subjective: no complaints, up ad lib, voiding and tolerating PO  Doing well, no concerns. Ambulating without difficulty, pain managed with PO meds, tolerating regular diet, and voiding without difficulty.   No fever/chills, chest pain, shortness of breath, nausea/vomiting, or leg pain. No nipple or breast pain. No headache, visual changes, or RUQ/epigastric pain.  Objective: BP 120/84 (BP Location: Right Arm)   Pulse 89   Temp 98.2 F (36.8 C) (Oral)   Resp 20   Ht 5\' 6"  (1.676 m)   Wt 65.8 kg   SpO2 100%   Breastfeeding Unknown   BMI 23.40 kg/m    Physical Exam:  General: alert, cooperative and no distress Breasts: soft/nontender CV: RRR Pulm: nl effort, CTABL Abdomen: soft, non-tender, active bowel sounds Uterine Fundus: firm Perineum: minimal edema, intact Lochia: appropriate DVT Evaluation: No evidence of DVT seen on physical exam.  Recent Labs    08/25/20 0737 08/26/20 0346  HGB 11.0* 11.1*  HCT 32.1* 32.6*  WBC 13.2* 12.6*  PLT 211 169    Assessment/Plan: 25 y.o. 10/26/20 postpartum day # 1  -Continue routine postpartum care -Encouraged snug fitting bra, cold application, Tylenol PRN, and cabbage leaves for engorgement for formula feeding  -Discussed contraceptive options including implant, IUDs hormonal and non-hormonal, injection, pills/ring/patch, condoms, and NFP. Desires Nexplanon.  -Hemodynamically stable -Immunization status: all immunizations up to date  Disposition: Desires discharge home today.  Infant pending 24 hour testing and car seat test.  If infant is medically cleared to discharge home will be able to discharge later this afternoon.     LOS: 1 day   [id number], Gustavo Lah 08/26/2020, 8:32 AM   ----- 10/26/2020  Certified Nurse Midwife [facility]

## 2020-08-26 NOTE — Progress Notes (Signed)
Mother discharged.  Discharge instructions given.  Mother verbalizes understanding.  Transported by auxiliary.  

## 2020-08-27 LAB — RHOGAM INJECTION: Unit division: 0

## 2021-05-08 ENCOUNTER — Other Ambulatory Visit: Payer: Self-pay

## 2021-05-08 ENCOUNTER — Emergency Department
Admission: EM | Admit: 2021-05-08 | Discharge: 2021-05-08 | Disposition: A | Discharge status: Home / Self Care | Payer: Medicaid Other | Attending: Emergency Medicine | Admitting: Emergency Medicine

## 2021-05-08 DIAGNOSIS — J3489 Other specified disorders of nose and nasal sinuses: Secondary | ICD-10-CM | POA: Insufficient documentation

## 2021-05-08 DIAGNOSIS — Z20822 Contact with and (suspected) exposure to covid-19: Secondary | ICD-10-CM | POA: Insufficient documentation

## 2021-05-08 DIAGNOSIS — J101 Influenza due to other identified influenza virus with other respiratory manifestations: Secondary | ICD-10-CM | POA: Insufficient documentation

## 2021-05-08 DIAGNOSIS — R059 Cough, unspecified: Secondary | ICD-10-CM | POA: Diagnosis present

## 2021-05-08 DIAGNOSIS — F17210 Nicotine dependence, cigarettes, uncomplicated: Secondary | ICD-10-CM | POA: Diagnosis not present

## 2021-05-08 DIAGNOSIS — Z8616 Personal history of COVID-19: Secondary | ICD-10-CM | POA: Insufficient documentation

## 2021-05-08 HISTORY — DX: Disorder of thyroid, unspecified: E07.9

## 2021-05-08 LAB — RESP PANEL BY RT-PCR (FLU A&B, COVID) ARPGX2
Influenza A by PCR: POSITIVE — AB
Influenza B by PCR: NEGATIVE
SARS Coronavirus 2 by RT PCR: NEGATIVE

## 2021-05-08 MED ORDER — ACETAMINOPHEN 325 MG PO TABS
650.0000 mg | ORAL_TABLET | Freq: Once | ORAL | Status: AC | PRN
  Administered 2021-05-08: 650 mg via ORAL
  Filled 2021-05-08: qty 2

## 2021-05-08 MED ORDER — ONDANSETRON 4 MG PO TBDP: 4.0000 mg | ORAL_TABLET | Freq: Three times a day (TID) | ORAL | 0 refills | Status: DC | PRN

## 2021-05-08 NOTE — Discharge Instructions (Signed)
Follow-up with your primary care provider if any continued problems.  Your test results are positive for influenza A.  Drink plenty of fluids to stay hydrated.  Tylenol or ibuprofen as needed for fever and body aches.  A prescription for Zofran was sent to your pharmacy as needed for nausea and vomiting.

## 2021-05-08 NOTE — ED Triage Notes (Signed)
Pt c/o cough, congestion, sore throat, runny nose, body aches, emesis, since friday

## 2021-05-08 NOTE — ED Provider Notes (Signed)
[facility] Emergency Department Provider Note   ____________________________________________   None    (approximate)  I have reviewed the triage vital signs and the nursing notes.   HISTORY  Chief Complaint URI    HPI Darlene Kelley is a 25 y.o. female presents to the ED with complaint of cough, congestion, sore throat, rhinorrhea and body aches.  Patient states symptoms started 3 days ago.  She also here with 2 children with similar symptoms.         Past Medical History:  Diagnosis Date   GERD (gastroesophageal reflux disease)    Thyroid disease     Patient Active Problem List   Diagnosis Date Noted   NSVD (normal spontaneous vaginal delivery) 08/25/2020   COVID-19 06/22/2020   Encounter for supervision of other normal pregnancy 05/28/2016    Past Surgical History:  Procedure Laterality Date   WISDOM TOOTH EXTRACTION      Prior to Admission medications   Medication Sig Start Date End Date Taking? Authorizing Provider  ondansetron (ZOFRAN ODT) 4 MG disintegrating tablet Take 1 tablet (4 mg total) by mouth every 8 (eight) hours as needed for nausea or vomiting. 05/08/21  Yes Tommi Rumps, PA-C  acetaminophen (TYLENOL) 325 MG tablet Take 2 tablets (650 mg total) by mouth every 4 (four) hours as needed (for pain scale < 4  OR  temperature  >/=  100.5 F). 08/06/20   McVey, Lurena Joiner A, CNM  ferrous fumarate-iron polysaccharide complex (TANDEM) 162-115.2 MG CAPS capsule Take 1 capsule by mouth daily with breakfast.    [provider]  ibuprofen (ADVIL) 600 MG tablet Take 1 tablet (600 mg total) by mouth every 6 (six) hours as needed. 08/26/20   Gustavo Lah, CNM  pantoprazole (PROTONIX) 40 MG tablet Take 1 tablet (40 mg total) by mouth daily. 08/07/20   McVey, Prudencio Pair, CNM    Allergies Patient has no known allergies.  No family history on file.  Social History Social History   Tobacco Use   Smoking status: Every Day     Packs/day: 0.25    Years: 4.00    Pack years: 1.00    Types: Cigarettes   Smokeless tobacco: Never  Vaping Use   Vaping Use: Every day  Substance Use Topics   Alcohol use: No    Comment: Occasional   Drug use: No    Review of Systems Constitutional: Positive fever/chills Eyes: No visual changes. ENT: Positive sore throat. Cardiovascular: Denies chest pain. Respiratory: Denies shortness of breath.  Positive for cough. Gastrointestinal: No abdominal pain.  No nausea, positive vomiting.  No diarrhea.  No constipation. Genitourinary: Negative for dysuria. Musculoskeletal: Positive body aches. Skin: Negative for rash. Neurological: Negative for headaches, focal weakness or numbness.   ____________________________________________   PHYSICAL EXAM:  VITAL SIGNS: ED Triage Vitals  Enc Vitals Group     BP 05/08/21 0735 (!) 147/130     Pulse Rate 05/08/21 0735 (!) 144     Resp 05/08/21 0735 18     Temp 05/08/21 0735 (!) 101.5 F (38.6 C)     Temp Source 05/08/21 0735 Oral     SpO2 05/08/21 0735 99 %     Weight 05/08/21 0733 140 lb (63.5 kg)     Height 05/08/21 0733 5\' 6"  (1.676 m)     Head Circumference --      Peak Flow --      Pain Score 05/08/21 0733 10  Pain Loc --      Pain Edu? --      Excl. in GC? --     Constitutional: Alert and oriented. Well appearing and in no acute distress. Eyes: Conjunctivae are normal. PERRL. EOMI. Head: Atraumatic. Nose: Mild congestion/rhinnorhea. Mouth/Throat: Mucous membranes are moist.  Oropharynx non-erythematous. Neck: No stridor.   Cardiovascular: Normal rate, regular rhythm. Grossly normal heart sounds.  Good peripheral circulation. Respiratory: Normal respiratory effort.  No retractions. Lungs CTAB. Gastrointestinal: Soft and nontender. No distention.  Bowel sounds normal active x4 quadrants. Musculoskeletal: Patient is ambulatory without any assistance. Neurologic:  Normal speech and language. No gross focal neurologic  deficits are appreciated. No gait instability. Skin:  Skin is warm, dry and intact. No rash noted. Psychiatric: Mood and affect are normal. Speech and behavior are normal.  ____________________________________________   LABS (all labs ordered are listed, but only abnormal results are displayed)  Labs Reviewed  RESP PANEL BY RT-PCR (FLU A&B, COVID) ARPGX2 - Abnormal; Notable for the following components:      Result Value   Influenza A by PCR POSITIVE (*)    All other components within normal limits   ____________________________________________  PROCEDURES  Procedure(s) performed (including Critical Care):  Procedures   ____________________________________________   INITIAL IMPRESSION / ASSESSMENT AND PLAN / ED COURSE  As part of my medical decision making, I reviewed the following data within the electronic MEDICAL RECORD NUMBER Notes from prior ED visits and [place] Controlled Substance Database  25 year old female presents to the ED with 2 smaller children with similar symptoms for the last 3 days.  Physical exam was benign with the exception of patient complaining of body aches frequently.  She also complains of some nausea but no vomiting was noted while she was in the ED.  A prescription for Zofran was sent to the pharmacy.  She is encouraged to drink fluids frequently.  She was made aware that dosage her children and she have influenza A.  She is to follow-up with her PCP if any continued problems or return to the emergency department if any severe worsening of her symptoms. ____________________________________________   FINAL CLINICAL IMPRESSION(S) / ED DIAGNOSES  Final diagnoses:  Influenza A     ED Discharge Orders          Ordered    ondansetron (ZOFRAN ODT) 4 MG disintegrating tablet  Every 8 hours PRN        05/08/21 6333             Note:  This document was prepared using Dragon voice recognition software and may include unintentional dictation errors.     Tommi Rumps, PA-C 05/08/21 1216    Chesley Noon, MD 05/08/21 [phone]

## 2022-06-26 IMAGING — US US ABDOMEN LIMITED RUQ/ASCITES
1 series · 14 of 25 positions shown · non-contrast
Comparison: None.

CLINICAL DATA: Nausea, vomiting.

EXAM:
ULTRASOUND ABDOMEN LIMITED RIGHT UPPER QUADRANT

[Series 1: us abdomen limited ruq (liver/gb) · 14 of 49 slices shown]
[im 1/49]
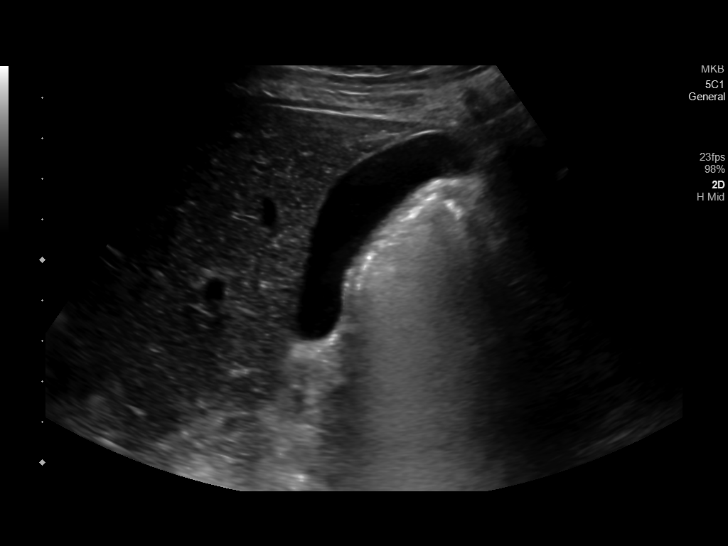
[im 5/49]
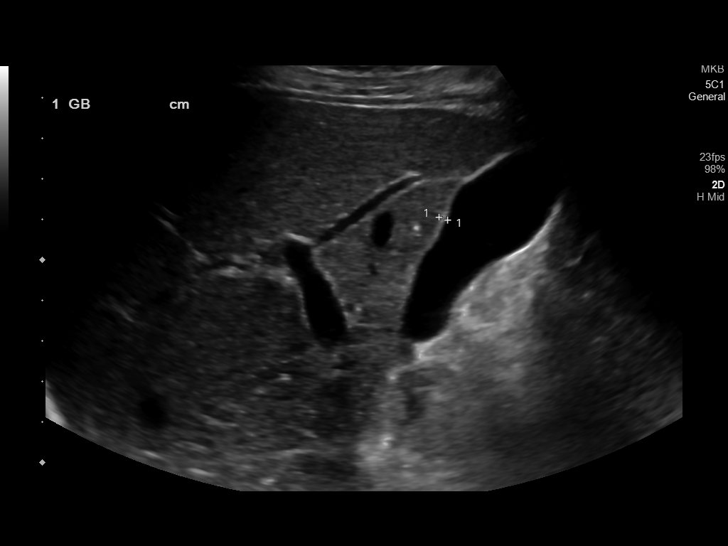
[im 9/49]
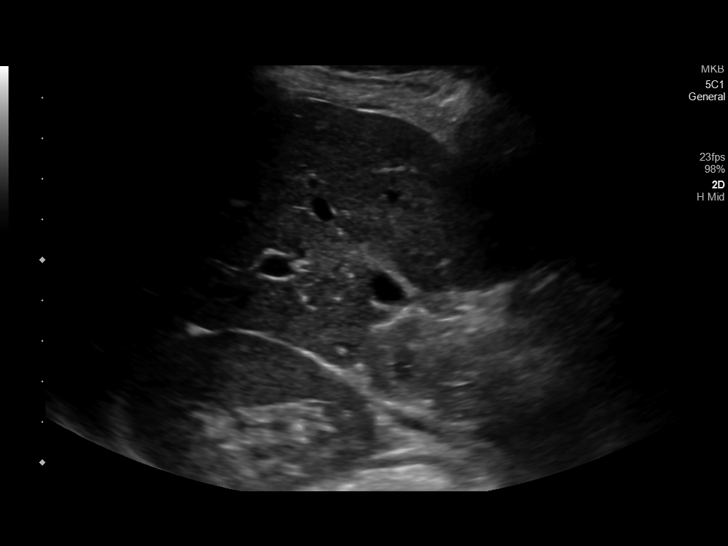
[im 13/49]
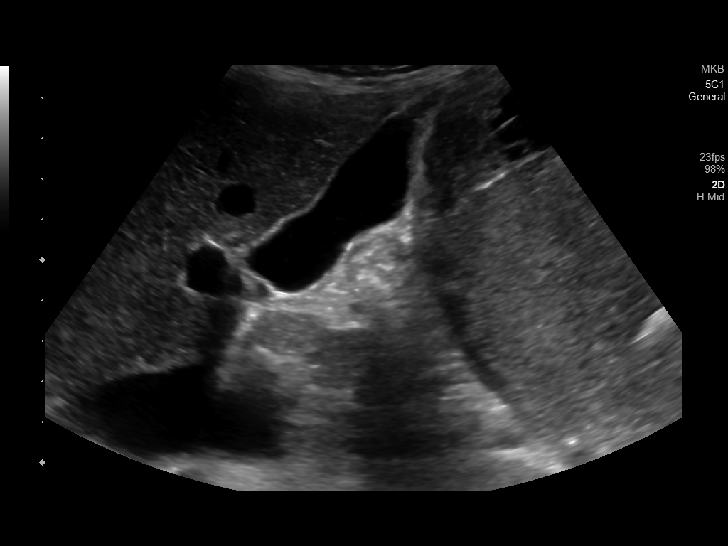
[im 17/49]
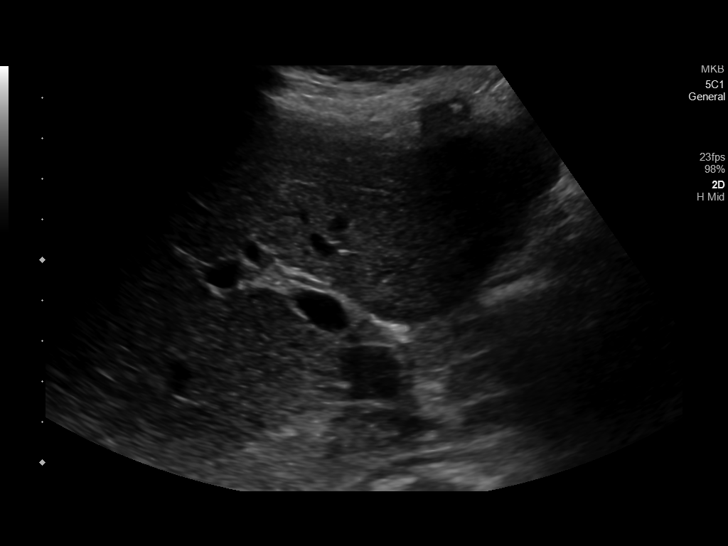
[im 19/49]
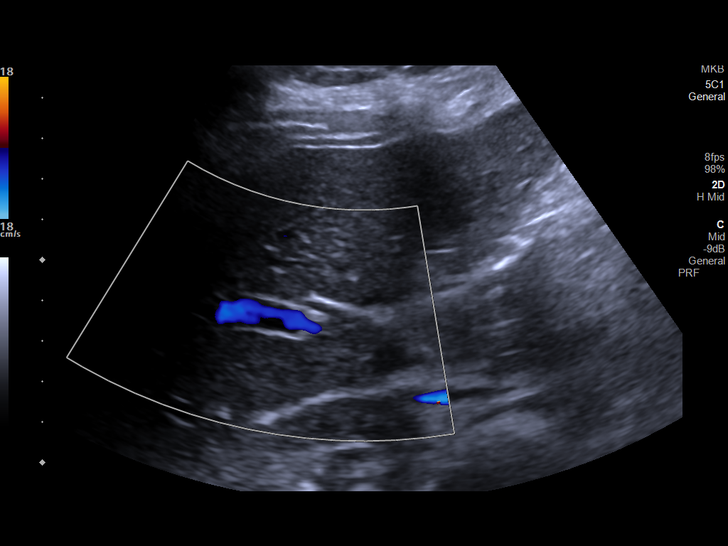
[im 23/49]
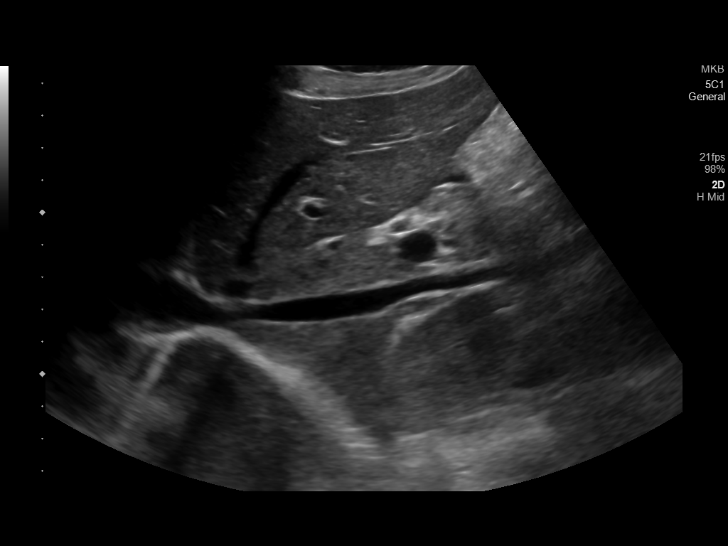
[im 27/49]
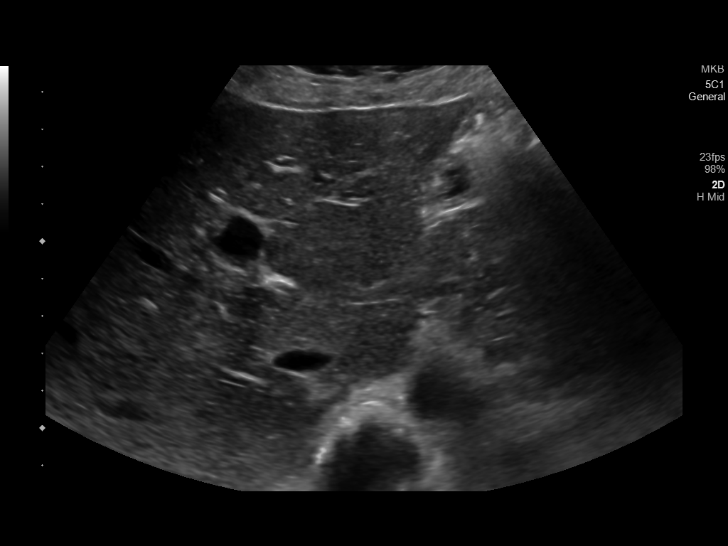
[im 31/49]
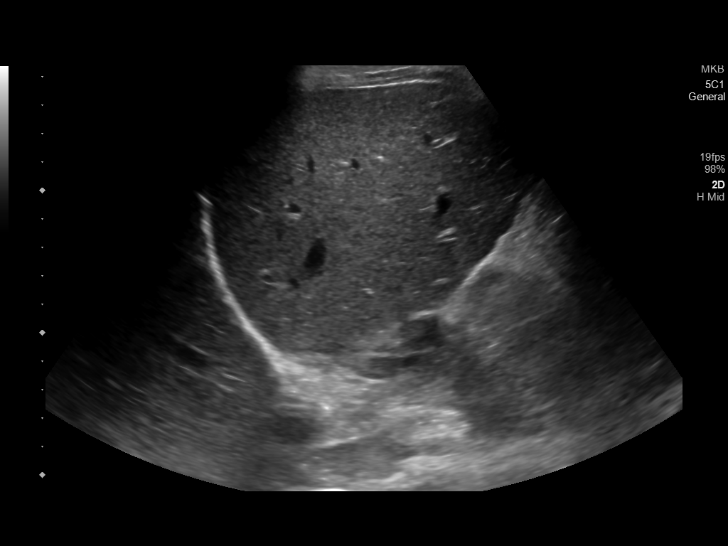
[im 33/49]
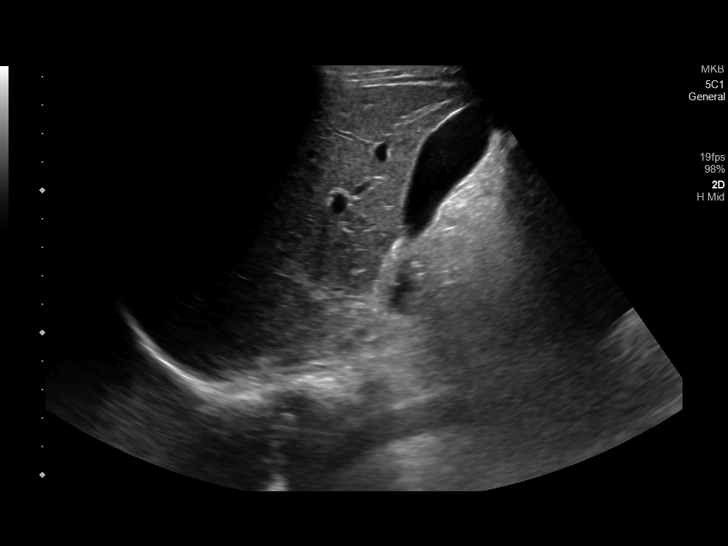
[im 37/49]
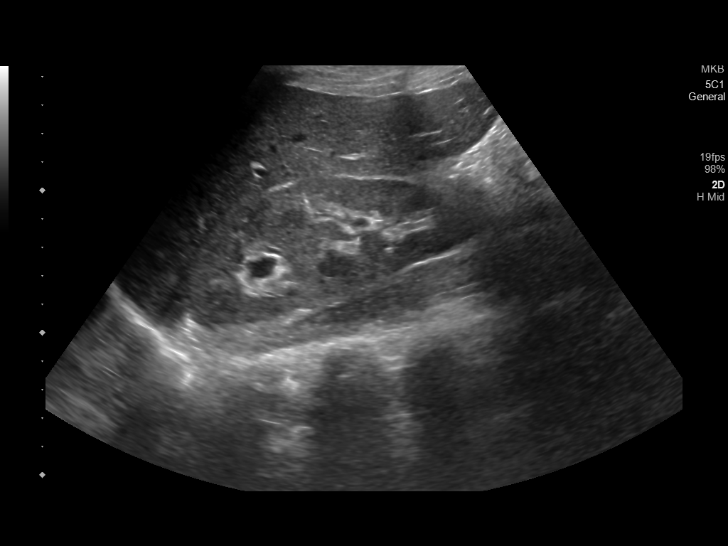
[im 41/49]
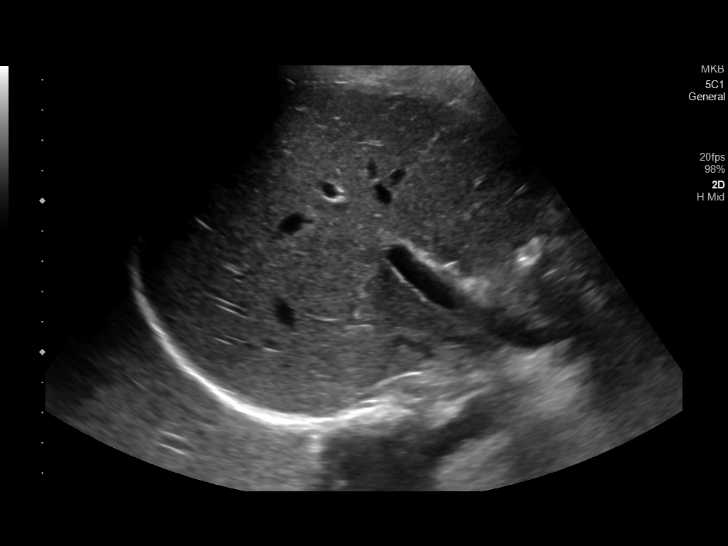
[im 45/49]
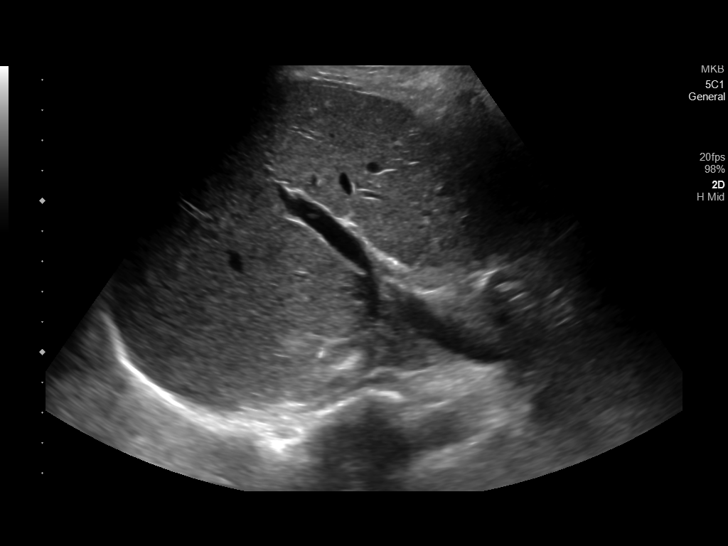
[im 49/49]
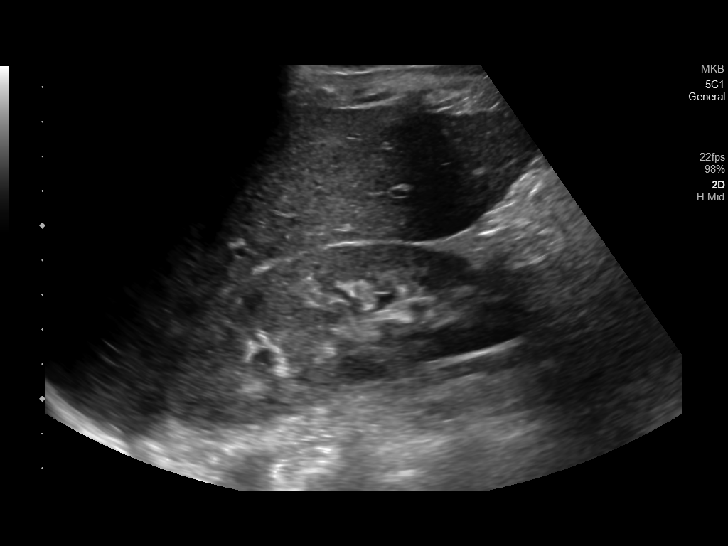

[14 of 25 positions shown; findings below may reference images not displayed]

FINDINGS: Gallbladder:

No gallstones or wall thickening visualized. No sonographic Murphy
sign noted by sonographer.

Common bile duct:

Diameter: 2 mm which is within normal limits.

Liver:

No focal lesion identified. Within normal limits in parenchymal
echogenicity. Portal vein is patent on color Doppler imaging with
normal direction of blood flow towards the liver.

Other: None.
IMPRESSION: No abnormality seen in the right upper quadrant of the abdomen.

## 2022-11-09 ENCOUNTER — Emergency Department: Payer: Medicaid Other

## 2022-11-09 ENCOUNTER — Other Ambulatory Visit: Payer: Self-pay

## 2022-11-09 ENCOUNTER — Emergency Department
Admission: EM | Admit: 2022-11-09 | Discharge: 2022-11-09 | Disposition: A | Discharge status: Home / Self Care | Payer: Medicaid Other | Attending: Emergency Medicine | Admitting: Emergency Medicine

## 2022-11-09 ENCOUNTER — Encounter: Payer: Self-pay | Admitting: Emergency Medicine

## 2022-11-09 DIAGNOSIS — O209 Hemorrhage in early pregnancy, unspecified: Secondary | ICD-10-CM | POA: Diagnosis present

## 2022-11-09 DIAGNOSIS — Z3A01 Less than 8 weeks gestation of pregnancy: Secondary | ICD-10-CM | POA: Insufficient documentation

## 2022-11-09 DIAGNOSIS — O208 Other hemorrhage in early pregnancy: Secondary | ICD-10-CM | POA: Diagnosis not present

## 2022-11-09 LAB — CBC
HCT: 39.5 % (ref 36.0–46.0)
Hemoglobin: 13.4 g/dL (ref 12.0–15.0)
MCH: 30 pg (ref 26.0–34.0)
MCHC: 33.9 g/dL (ref 30.0–36.0)
MCV: 88.6 fL (ref 80.0–100.0)
Platelets: 220 10*3/uL (ref 150–400)
RBC: 4.46 MIL/uL (ref 3.87–5.11)
RDW: 11.9 % (ref 11.5–15.5)
WBC: 9.9 10*3/uL (ref 4.0–10.5)
nRBC: 0 % (ref 0.0–0.2)

## 2022-11-09 LAB — BASIC METABOLIC PANEL
Anion gap: 7 (ref 5–15)
BUN: 13 mg/dL (ref 6–20)
CO2: 24 mmol/L (ref 22–32)
Calcium: 9.8 mg/dL (ref 8.9–10.3)
Chloride: 104 mmol/L (ref 98–111)
Creatinine, Ser: 0.7 mg/dL (ref 0.44–1.00)
GFR, Estimated: >60 mL/min (ref >60)
Glucose, Bld: 99 mg/dL (ref 70–99)
Potassium: 3.5 mmol/L (ref 3.5–5.1)
Sodium: 135 mmol/L (ref 135–145)

## 2022-11-09 LAB — POC URINE PREG, ED: Preg Test, Ur: POSITIVE — AB

## 2022-11-09 LAB — ANTIBODY SCREEN: Antibody Screen: NEGATIVE

## 2022-11-09 LAB — RHOGAM INJECTION

## 2022-11-09 LAB — HCG, QUANTITATIVE, PREGNANCY: hCG, Beta Chain, Quant, S: 3018 m[IU]/mL — ABNORMAL HIGH (ref <5)

## 2022-11-09 LAB — ABO/RH: ABO/RH(D): O NEG

## 2022-11-09 MED ORDER — RHO D IMMUNE GLOBULIN 1500 UNIT/2ML IJ SOSY
300.0000 ug | PREFILLED_SYRINGE | Freq: Once | INTRAMUSCULAR | Status: AC
  Administered 2022-11-09: 300 ug via INTRAMUSCULAR
  Filled 2022-11-09: qty 2

## 2022-11-09 NOTE — ED Triage Notes (Signed)
C/O spotting x 2 days.  Confirmed pregnancy with PCP early May.  G8 P4 A3.  Had appointment with Darlene Kelley at 11 am for symptoms, but came into ED for evaluation.  Also c/o lower abdominal cramping. Intermittently.

## 2022-11-09 NOTE — Discharge Instructions (Addendum)
Call the clinic listed on your discharge papers to arrange a follow-up visit and let them know that you were seen in the emergency department.  Currently your ultrasound shows that you are 5 weeks and 5 days with a single pregnancy.

## 2022-11-09 NOTE — ED Provider Notes (Signed)
[facility] Provider Note    Event Date/Time   First MD Initiated Contact with Patient 11/09/22 [phone]     (approximate)   History   Vaginal Bleeding   HPI  Darlene Kelley is a 27 y.o. female to the ED with complaint of vaginal this morning with confirmed pregnancy early May.  Patient states she has an appointment at [facility] for her symptoms but came to the emergency department.  She also is experiencing some abdominal cramping intermittently. G8 P4 A3.     Physical Exam   Triage Vital Signs: ED Triage Vitals  Enc Vitals Group     BP 11/09/22 0936 128/76     Pulse Rate 11/09/22 0936 (!) 104     Resp 11/09/22 0936 16     Temp 11/09/22 0936 98.3 F (36.8 C)     Temp Source 11/09/22 0936 Oral     SpO2 11/09/22 0936 100 %     Weight 11/09/22 0934 139 lb 15.9 oz (63.5 kg)     Height 11/09/22 0934 5\' 6"  (1.676 m)     Head Circumference --      Peak Flow --      Pain Score 11/09/22 0933 5     Pain Loc --      Pain Edu? --      Excl. in GC? --     Most recent vital signs: Vitals:   11/09/22 1255 11/09/22 1330  BP: 120/70 122/70  Pulse: 98 90  Resp: 16 16  Temp:    SpO2: 99% 99%     General: Awake, no distress.  Tearful. CV:  Good peripheral perfusion.  Resp:  Normal effort.  Abd:  No distention.  Other:     ED Results / Procedures / Treatments   Labs (all labs ordered are listed, but only abnormal results are displayed) Labs Reviewed  HCG, QUANTITATIVE, PREGNANCY - Abnormal; Notable for the following components:      Result Value   hCG, Beta Chain, Quant, S 3,018 (*)    All other components within normal limits  POC URINE PREG, ED - Abnormal; Notable for the following components:   Preg Test, Ur POSITIVE (*)    All other components within normal limits  CBC  BASIC METABOLIC PANEL  ABO/RH  ANTIBODY SCREEN  RHOGAM INJECTION     RADIOLOGY US OB/GYN less than 14 weeks per radiologist is for a single IUP with at 5  weeks and 5 days with a heart rate of 66.  Subchorionic hemorrhage noted.    PROCEDURES:  Critical Care performed:   Procedures   MEDICATIONS ORDERED IN ED: Medications  rho (d) immune globulin (RHIG/RHOPHYLAC) injection 300 mcg (300 mcg Intramuscular Given 11/09/22 1246)     IMPRESSION / MDM / ASSESSMENT AND PLAN / ED COURSE  I reviewed the triage vital signs and the nursing notes.   Differential diagnosis includes, but is not limited to, threatened abortion, early pregnancy with vaginal bleeding, subchorionic hemorrhage, missed abortion.  27 year old female presents to the ED with noted vaginal blood/spotting that began this morning.  Patient has had some cramping.  Urine pregnancy test was positive and beta hCG was 3,018.  BCM BMP were unremarkable.  ABO/Rh was O negative.  Antibody screen was performed and RhoGAM injection ordered.  Ultrasound report shows subchorionic hemorrhage with a single IUP at 5 weeks and 5 days with a heart rate of 66, which was abnormally low.  Patient is to  call make an appointment for follow-up information by [facility] was given to her she does not currently have an OB/GYN.  She was instructed not to take any anti-inflammatories and if needed she could use a heating pad and sparingly use Tylenol for discomfort.  She is aware that she needs follow-up due to findings on her ultrasound.        Patient's presentation is most consistent with acute illness / injury with system symptoms.  FINAL CLINICAL IMPRESSION(S) / ED DIAGNOSES   Final diagnoses:  Vaginal bleeding in pregnancy, first trimester  Subchorionic hemorrhage in first trimester     Rx / DC Orders   ED Discharge Orders     None        Note:  This document was prepared using Dragon voice recognition software and may include unintentional dictation errors.   Tommi Rumps, PA-C 11/09/22 1417    Merwyn Katos, MD 11/09/22 [phone]

## 2022-11-10 LAB — RHOGAM INJECTION: Unit division: 0

## 2022-11-13 ENCOUNTER — Other Ambulatory Visit: Payer: Self-pay | Admitting: Obstetrics

## 2022-11-13 ENCOUNTER — Ambulatory Visit (INDEPENDENT_AMBULATORY_CARE_PROVIDER_SITE_OTHER): Payer: Medicaid Other | Admitting: Obstetrics

## 2022-11-13 ENCOUNTER — Encounter: Payer: Self-pay | Admitting: Obstetrics

## 2022-11-13 ENCOUNTER — Other Ambulatory Visit (INDEPENDENT_AMBULATORY_CARE_PROVIDER_SITE_OTHER): Payer: Medicaid Other

## 2022-11-13 VITALS — Ht 66.0 in | Wt 131.0 lb

## 2022-11-13 DIAGNOSIS — O039 Complete or unspecified spontaneous abortion without complication: Secondary | ICD-10-CM

## 2022-11-13 DIAGNOSIS — Z3A01 Less than 8 weeks gestation of pregnancy: Secondary | ICD-10-CM

## 2022-11-13 DIAGNOSIS — O4690 Antepartum hemorrhage, unspecified, unspecified trimester: Secondary | ICD-10-CM

## 2022-11-13 DIAGNOSIS — O200 Threatened abortion: Secondary | ICD-10-CM | POA: Diagnosis not present

## 2022-11-13 NOTE — Progress Notes (Signed)
GYN ENCOUNTER  Subjective  HPI: Darlene Kelley is a 27 y.o. [id number] who presents today for vaginal bleeding in pregnancy. She was seen in the ED on 11/09/22. US showed a [redacted]w[redacted]d gestation with a heart rate of 66 and subchorionic hemorrhage. She has continued bleeding like a period and had cramping since then. She has a h/o of thyroid disorder.  Past Medical History:  Diagnosis Date   GERD (gastroesophageal reflux disease)    Thyroid disease    Past Surgical History:  Procedure Laterality Date   WISDOM TOOTH EXTRACTION     OB History     Gravida  8   Para  4   Term  4   Preterm  0   AB  3   Living  4      SAB  0   IAB  3   Ectopic  0   Multiple  0   Live Births  4          No Known Allergies  ROS: Pertinent items noted in HPI  Objective  Ht 5\' 6"  (1.676 m)   Wt 131 lb (59.4 kg)   LMP 09/25/2022   Breastfeeding No   BMI 21.14 kg/m   Physical examination deferred.   [place] today shows no IUP   Assessment SAB, complete  Plan  1) Discussed findings and expected course of recovery. Reviewed danger signs and when to go to the hospital. Darlene Kelley would like to attempt pregnancy soon. Encouraged to wait until normal menses have resumed before attempting conception again. Recommend smoking cessation. Will check thyroid labs with f/u bhCG.   Guadlupe Spanish, CNM

## 2022-11-14 ENCOUNTER — Telehealth: Payer: Self-pay

## 2022-11-14 LAB — BETA HCG QUANT (REF LAB): hCG Quant: 293 m[IU]/mL

## 2022-11-15 NOTE — Telephone Encounter (Signed)
Letter sent unable to leave VM to let her know, sent mychart mes

## 2022-11-15 NOTE — Telephone Encounter (Signed)
Pt called after hour nurse 11/15/22 12:23pm saying she had a missed call for the office; she did have lab results done and she believes that is why they were calling; After hour nurse provided office hours.  [phone]

## 2022-11-22 NOTE — Telephone Encounter (Signed)
The patient was contacted by Tonya,and scheduled for labs on 6/3.

## 2022-11-27 ENCOUNTER — Other Ambulatory Visit: Payer: Medicaid Other

## 2022-11-27 ENCOUNTER — Telehealth: Payer: Self-pay | Admitting: Obstetrics

## 2022-11-27 NOTE — Telephone Encounter (Signed)
Reached out to pt to reschedule lab appt that was scheduled on 11/27/2022.  Was able to reschedule the lab appt for 12/11/2022 at 2:20.

## 2022-12-11 ENCOUNTER — Other Ambulatory Visit: Payer: Medicaid Other

## 2022-12-11 ENCOUNTER — Telehealth: Payer: Self-pay | Admitting: Obstetrics

## 2022-12-11 NOTE — Telephone Encounter (Signed)
Reached out to pt to reschedule lab appt that was scheduled on 12/11/2022 at 2:20 (per [facility]  Could not leave a message bc mailbox was full.

## 2022-12-12 NOTE — Telephone Encounter (Signed)
Appt was rescheduled for 12/18/2022 for labs.

## 2022-12-18 ENCOUNTER — Telehealth: Payer: Self-pay | Admitting: Obstetrics

## 2022-12-18 ENCOUNTER — Other Ambulatory Visit: Payer: Medicaid Other

## 2022-12-18 NOTE — Telephone Encounter (Signed)
Reached out to pt to reschedule lab appt that was scheduled on 12/18/2022 at 8:40.  Was able to reschedule the lab appt to 12/21/2022 at 3:40.

## 2022-12-18 NOTE — Telephone Encounter (Signed)
Reached out to pt to reschedule lab appt that was scheduled on 12/18/2022 at 8:40.  Was able to reschedule appt for 12/21/22 at 3:40.

## 2022-12-21 ENCOUNTER — Telehealth: Payer: Self-pay | Admitting: Obstetrics

## 2022-12-21 ENCOUNTER — Other Ambulatory Visit: Payer: Medicaid Other

## 2022-12-21 NOTE — Telephone Encounter (Signed)
Reached out to pt to reschedule lab appt that was scheduled on 12/21/2022 at 3:40.  Mailbox was full, could not leave a message.

## 2022-12-22 NOTE — Telephone Encounter (Signed)
Reached out to pt (2x) to reschedule lab appt that was scheduled on 12/21/2022 at 3:40.  Was able to reschedule the appt to 01/02/2023 at 11:00.

## 2023-01-02 ENCOUNTER — Other Ambulatory Visit: Payer: Medicaid Other

## 2023-01-02 ENCOUNTER — Telehealth: Payer: Self-pay | Admitting: Obstetrics

## 2023-01-02 NOTE — Telephone Encounter (Signed)
Reached out to pt to reschedule labs per Missy that were scheduled on 79/2024 at 11:00.  Was able to reschedule the appt for 01/08/2023 at 3:40.

## 2023-01-05 ENCOUNTER — Other Ambulatory Visit: Payer: Medicaid Other

## 2023-01-08 ENCOUNTER — Telehealth: Payer: Self-pay | Admitting: Obstetrics

## 2023-01-08 NOTE — Telephone Encounter (Signed)
Reached out to pt to reschedule lab appt [place]) that was scheduled on 01/05/2023 at 3:40.  Mailbox was full, could not leave a message.

## 2023-01-10 ENCOUNTER — Encounter: Payer: Self-pay | Admitting: Obstetrics

## 2023-01-10 NOTE — Telephone Encounter (Signed)
FYI this patient hasn't completed the labs you have requested for her care. There has been multiple attempt to get her scheduled.

## 2023-01-10 NOTE — Telephone Encounter (Signed)
I contacted the patient via phone. No answer, " called couldn't be completed as dialed". I will sent missed appointment letter via mail. Patient hasn't been logged in to [facility] since 11/13/22.

## 2023-01-10 NOTE — Telephone Encounter (Signed)
Missed appointment letter mailed 

## 2023-10-24 ENCOUNTER — Emergency Department: Admission: EM | Admit: 2023-10-24 | Discharge: 2023-10-24 | Disposition: A | Discharge status: Home / Self Care

## 2023-10-24 ENCOUNTER — Other Ambulatory Visit: Payer: Self-pay

## 2023-10-24 DIAGNOSIS — S70352A Superficial foreign body, left thigh, initial encounter: Secondary | ICD-10-CM | POA: Insufficient documentation

## 2023-10-24 DIAGNOSIS — W458XXA Other foreign body or object entering through skin, initial encounter: Secondary | ICD-10-CM | POA: Diagnosis not present

## 2023-10-24 DIAGNOSIS — T148XXA Other injury of unspecified body region, initial encounter: Secondary | ICD-10-CM

## 2023-10-24 MED ORDER — LIDOCAINE-EPINEPHRINE (PF) 2 %-1:200000 IJ SOLN
10.0000 mL | Freq: Once | INTRAMUSCULAR | Status: DC
  Filled 2023-10-24: qty 20

## 2023-10-24 NOTE — ED Provider Notes (Signed)
 [facility] Provider Note    Event Date/Time   First MD Initiated Contact with Patient 10/24/23 1411     (approximate)   History   Foreign Body in Skin  Pt to ED via POV from home. Pt reports was fishing and got fish hook stuck in left upper thigh. Pt reports pain 9/10. Unsure if up to date on tetanus.    HPI Darlene Kelley is a 28 y.o. female no related past medical history presents for evaluation of a fishing hook injury - Patient was fishing, line got stuck in a tree, pulled it and states the fishhook landed in her left thigh.  They were unable to remove it on their own so come to emergency department for eval.  Last tdap 2022 per chart review.         Physical Exam   Triage Vital Signs: ED Triage Vitals  Encounter Vitals Group     BP 10/24/23 1257 111/71     Systolic BP Percentile --      Diastolic BP Percentile --      Pulse Rate 10/24/23 1257 84     Resp 10/24/23 1257 18     Temp 10/24/23 1257 98.6 F (37 C)     Temp Source 10/24/23 1257 Oral     SpO2 10/24/23 1257 100 %     Weight 10/24/23 1258 130 lb (59 kg)     Height 10/24/23 1258 5\' 4"  (1.626 m)     Head Circumference --      Peak Flow --      Pain Score 10/24/23 1301 9     Pain Loc --      Pain Education --      Exclude from Growth Chart --     Most recent vital signs: Vitals:   10/24/23 1257  BP: 111/71  Pulse: 84  Resp: 18  Temp: 98.6 F (37 C)  SpO2: 100%     General: Awake, no distress.  CV:  Good peripheral perfusion Resp:  Normal effort Abd:  No distention. Nontender to deep palpation throughout LLE:  Fishhook embedded in proximal left thigh.  Based on the curvature, appears about 1 cm may be embedded still.  Hemostatic.   ED Results / Procedures / Treatments   Labs (all labs ordered are listed, but only abnormal results are displayed) Labs Reviewed - No data to display   EKG  N/a   RADIOLOGY N/a    PROCEDURES:  Critical Care performed:  No  .Foreign Body Removal  Date/Time: 10/24/2023 2:47 PM  Performed by: Collis Deaner, MD Authorized by: Collis Deaner, MD  Consent: Verbal consent obtained. Risks and benefits: risks, benefits and alternatives were discussed Consent given by: patient Patient understanding: patient states understanding of the procedure being performed Patient consent: the patient's understanding of the procedure matches consent given Procedure consent: procedure consent matches procedure scheduled Patient identity confirmed: verbally with patient and arm band Body area: skin General location: lower extremity Location details: left hip Anesthesia: local infiltration  Anesthesia: Local Anesthetic: lidocaine  2% with epinephrine  Anesthetic total: 2 mL  Sedation: Patient sedated: no  Patient restrained: no Patient cooperative: yes Removal mechanism: forceps Dressing: dressing applied 1 objects recovered. Objects recovered: 1 full fishhook Post-procedure assessment: foreign body removed     MEDICATIONS ORDERED IN ED: Medications  lidocaine -EPINEPHrine  (XYLOCAINE  W/EPI) 2 %-1:200000 (PF) injection 10 mL (has no administration in time range)     IMPRESSION / MDM /  ASSESSMENT AND PLAN / ED COURSE  I reviewed the triage vital signs and the nursing notes.                              DDX/MDM/AP: Differential diagnosis includes, but is not limited to, simple fishhook foreign body in thigh.  Plan: - Lidocaine  - Suspect will require stab incision to get into fishhook out of thigh -Very superficial, no indication for imaging, no c/f multiple FB - Tdap up-to-date  Patient's presentation is most consistent with acute, uncomplicated illness.    ED course below.  Single barbed fishhook removed, required 2 stab incisions.  No concern for retained foreign body, appears fully intact and was quite superficial.  Subsequently cleaned wound with soap and water.  Bandage applied.      FINAL  CLINICAL IMPRESSION(S) / ED DIAGNOSES   Final diagnoses:  Skin foreign body     Rx / DC Orders   ED Discharge Orders     None        Note:  This document was prepared using Dragon voice recognition software and may include unintentional dictation errors.   Collis Deaner, MD 10/24/23 1450

## 2023-10-24 NOTE — Discharge Instructions (Signed)
 The fishhook in your left thigh was removed.  Please follow-up with your doctor as needed for wound rechecks.  Return to the emergency department with any new or worsening symptoms.

## 2023-10-24 NOTE — ED Triage Notes (Signed)
 Pt to ED via POV from home. Pt reports was fishing and got fish hook stuck in left upper thigh. Pt reports pain 9/10. Unsure if up to date on tetanus.
